# Patient Record
Sex: Female | Born: 1995 | Race: Black or African American | Hispanic: No | Marital: Single | Smoking: Current every day smoker
Health system: Southern US, Community
[De-identification: ages and names within clinical notes are randomized; demographics above are authoritative.]

## PROBLEM LIST (undated history)

## (undated) DIAGNOSIS — N390 Urinary tract infection, site not specified: Secondary | ICD-10-CM

## (undated) DIAGNOSIS — T7840XA Allergy, unspecified, initial encounter: Secondary | ICD-10-CM

## (undated) DIAGNOSIS — G43909 Migraine, unspecified, not intractable, without status migrainosus: Secondary | ICD-10-CM

## (undated) DIAGNOSIS — R51 Headache: Secondary | ICD-10-CM

## (undated) DIAGNOSIS — R519 Headache, unspecified: Secondary | ICD-10-CM

## (undated) HISTORY — DX: Migraine, unspecified, not intractable, without status migrainosus: G43.909

## (undated) HISTORY — DX: Headache, unspecified: R51.9

## (undated) HISTORY — PX: BURN TREATMENT: PRO154

## (undated) HISTORY — DX: Allergy, unspecified, initial encounter: T78.40XA

## (undated) HISTORY — DX: Headache: R51

## (undated) HISTORY — DX: Urinary tract infection, site not specified: N39.0

## (undated) HISTORY — PX: NO PAST SURGERIES: SHX2092

---
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
## 2014-04-28 ENCOUNTER — Encounter (HOSPITAL_COMMUNITY): Payer: Self-pay | Admitting: Emergency Medicine

## 2014-04-28 ENCOUNTER — Emergency Department (HOSPITAL_COMMUNITY)
Admission: EM | Admit: 2014-04-28 | Discharge: 2014-04-28 | Disposition: A | Discharge status: Home / Self Care | Payer: BC Managed Care – PPO | Attending: Emergency Medicine | Admitting: Emergency Medicine

## 2014-04-28 DIAGNOSIS — Y92410 Unspecified street and highway as the place of occurrence of the external cause: Secondary | ICD-10-CM | POA: Insufficient documentation

## 2014-04-28 DIAGNOSIS — IMO0002 Reserved for concepts with insufficient information to code with codable children: Secondary | ICD-10-CM | POA: Insufficient documentation

## 2014-04-28 DIAGNOSIS — Y9389 Activity, other specified: Secondary | ICD-10-CM | POA: Insufficient documentation

## 2014-04-28 DIAGNOSIS — S29012A Strain of muscle and tendon of back wall of thorax, initial encounter: Secondary | ICD-10-CM

## 2014-04-28 DIAGNOSIS — G44209 Tension-type headache, unspecified, not intractable: Secondary | ICD-10-CM

## 2014-04-28 MED ORDER — DIAZEPAM 5 MG PO TABS: 5.0000 mg | ORAL_TABLET | Freq: Two times a day (BID) | ORAL | Status: DC

## 2014-04-28 MED ORDER — HYDROCODONE-ACETAMINOPHEN 5-325 MG PO TABS: 1.0000 | ORAL_TABLET | Freq: Four times a day (QID) | ORAL | Status: DC | PRN

## 2014-04-28 MED ORDER — NAPROXEN 500 MG PO TABS: 500.0000 mg | ORAL_TABLET | Freq: Two times a day (BID) | ORAL | Status: DC
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
## 2014-04-28 NOTE — Discharge Instructions (Signed)
Recommend naproxen as prescribed as well as Valium for symptoms. You may take Norco as needed for severe pain. Recommend you get plenty of rest and drink plenty of water this evening. Followup with your primary care doctor in 2 days.  Tension Headache A tension headache is pain, pressure, or aching felt over the front and sides of the head. Tension headaches often come after stress, feeling worried (anxiety), or feeling sad or down for a while (depressed). HOME CARE  Only take medicine as told by your doctor.  Lie down in a dark, quiet room when you have a headache.  Keep a journal to find out if certain things bring on headaches. For example, write down:  What you eat and drink.  How much sleep you get.  Any change to your diet or medicines.  Relax by getting a massage or doing other relaxing activities.  Put ice or heat packs on the head and neck area as told by your doctor.  Lessen stress.  Sit up straight. Do not tighten (tense) your muscles.  Quit smoking if you smoke.  Lessen how much alcohol you drink.  Lessen how much caffeine you drink, or stop drinking caffeine.  Eat and exercise regularly.  Get enough sleep.  Avoid using too much pain medicine. GET HELP RIGHT AWAY IF:   Your headache becomes really bad.  You have a fever.  You have a stiff neck.  You have trouble seeing.  Your muscles are weak, or you lose muscle control.  You lose your balance or have trouble walking.  You feel like you will pass out (faint), or you pass out.  You have really bad symptoms that are different than your first symptoms.  You have problems with the medicines given to you by your doctor.  Your medicines do not work.  Your headache feels different than the other headaches.  You feel sick to your stomach (nauseous) or throw up (vomit). MAKE SURE YOU:   Understand these instructions.  Will watch your condition.  Will get help right away if you are not doing well  or get worse. Document Released: 01/17/2010 Document Revised: 01/15/2012 Document Reviewed: 10/13/2011 [facility] Patient Information 2015 [place], [place]. This information is not intended to replace advice given to you by your health care provider. Make sure you discuss any questions you have with your health care provider. Muscle Strain A muscle strain is an injury that occurs when a muscle is stretched beyond its normal length. Usually a small number of muscle fibers are torn when this happens. Muscle strain is rated in degrees. First-degree strains have the least amount of muscle fiber tearing and pain. Second-degree and third-degree strains have increasingly more tearing and pain.  Usually, recovery from muscle strain takes 1-2 weeks. Complete healing takes 5-6 weeks.  CAUSES  Muscle strain happens when a sudden, violent force placed on a muscle stretches it too far. This may occur with lifting, sports, or a fall.  RISK FACTORS Muscle strain is especially common in athletes.  SIGNS AND SYMPTOMS At the site of the muscle strain, there may be:  Pain.  Bruising.  Swelling.  Difficulty using the muscle due to pain or lack of normal function. DIAGNOSIS  Your health care provider will perform a physical exam and ask about your medical history. TREATMENT  Often, the best treatment for a muscle strain is resting, icing, and applying cold compresses to the injured area.  HOME CARE INSTRUCTIONS   Use the PRICE method of  treatment to promote muscle healing during the first 2-3 days after your injury. The PRICE method involves:  Protecting the muscle from being injured again.  Restricting your activity and resting the injured body part.  Icing your injury. To do this, put ice in a plastic bag. Place a towel between your skin and the bag. Then, apply the ice and leave it on from 15-20 minutes each hour. After the third day, switch to moist heat packs.  Apply compression to the injured area  with a splint or elastic bandage. Be careful not to wrap it too tightly. This may interfere with blood circulation or increase swelling.  Elevate the injured body part above the level of your heart as often as you can.  Only take over-the-counter or prescription medicines for pain, discomfort, or fever as directed by your health care provider.  Warming up prior to exercise helps to prevent future muscle strains. SEEK MEDICAL CARE IF:   You have increasing pain or swelling in the injured area.  You have numbness, tingling, or a significant loss of strength in the injured area. MAKE SURE YOU:   Understand these instructions.  Will watch your condition.  Will get help right away if you are not doing well or get worse. Document Released: 10/23/2005 Document Revised: 08/13/2013 Document Reviewed: 05/22/2013 [facility]ExitCare Patient Information 2015 [place], [place]. This information is not intended to replace advice given to you by your health care provider. Make sure you discuss any questions you have with your health care provider.
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
## 2014-04-29 NOTE — ED Provider Notes (Signed)
Medical screening examination/treatment/procedure(s) were performed by non-physician practitioner and as supervising physician I was immediately available for consultation/collaboration.   EKG Interpretation None        Aleathia Purdy S Daniah Zaldivar, MD 04/29/14 0934 

## 2015-04-29 ENCOUNTER — Emergency Department (HOSPITAL_COMMUNITY)
Admission: EM | Admit: 2015-04-29 | Discharge: 2015-04-29 | Disposition: A | Discharge status: Home / Self Care | Payer: BLUE CROSS/BLUE SHIELD | Attending: Emergency Medicine | Admitting: Emergency Medicine

## 2015-04-29 ENCOUNTER — Encounter (HOSPITAL_COMMUNITY): Payer: Self-pay | Admitting: Emergency Medicine

## 2015-04-29 DIAGNOSIS — F32A Depression, unspecified: Secondary | ICD-10-CM

## 2015-04-29 DIAGNOSIS — F329 Major depressive disorder, single episode, unspecified: Secondary | ICD-10-CM | POA: Diagnosis not present

## 2015-04-29 DIAGNOSIS — R45851 Suicidal ideations: Secondary | ICD-10-CM | POA: Diagnosis present

## 2015-04-29 NOTE — ED Provider Notes (Signed)
CSN: [id number]     Arrival date & time 04/29/15  1646 History   First MD Initiated Contact with Patient 04/29/15 1733     Chief Complaint  Patient presents with  . Suicidal     (Consider location/radiation/quality/duration/timing/severity/associated sxs/prior Treatment) HPI  19 year old female presents voluntarily after her depression has signal really worsened to the point that she briefly thought she wanted to die. She has no plan to commit suicide and currently is not suicidal. The patient states she's been depressed due to multiple issues, boyfriend, work, and finances. These have been increasing over the past several weeks to months. Does not currently see a psychiatrist or counselor. The patient states now she feels depressed but she does not want to kill her self or to die and she was to go home with her mother. She was to see a counselor that is approved by her mother's insurance. No homicidal thoughts. Denies substance abuse.  History reviewed. No pertinent past medical history. History reviewed. No pertinent past surgical history. History reviewed. No pertinent family history. History  Substance Use Topics  . Smoking status: Never Smoker   . Smokeless tobacco: Not on file  . Alcohol Use: No   OB History    No data available     Review of Systems  Constitutional: Negative for fever.  Respiratory: Negative for cough and shortness of breath.   Gastrointestinal: Negative for vomiting.  Psychiatric/Behavioral: Positive for suicidal ideas and dysphoric mood.  All other systems reviewed and are negative.     Allergies  Review of patient's allergies indicates no known allergies.  Home Medications   Prior to Admission medications   Medication Sig Start Date End Date Taking? Authorizing Provider  naproxen sodium (ANAPROX) 220 MG tablet Take 220 mg by mouth 2 (two) times daily as needed (headache and menstrual cramps).   Yes Historical Provider, MD  diazepam (VALIUM) 5 MG  tablet Take 1 tablet (5 mg total) by mouth 2 (two) times daily. Patient not taking: Reported on 04/29/2015 04/28/14   Antony Madura, PA-C  HYDROcodone-acetaminophen (NORCO/VICODIN) 5-325 MG per tablet Take 1 tablet by mouth every 6 (six) hours as needed for moderate pain or severe pain. Patient not taking: Reported on 04/29/2015 04/28/14   Antony Madura, PA-C  naproxen (NAPROSYN) 500 MG tablet Take 1 tablet (500 mg total) by mouth 2 (two) times daily. Patient not taking: Reported on 04/29/2015 04/28/14   Antony Madura, PA-C   BP 123/70 mmHg  Pulse 85  Temp(Src) 98.8 F (37.1 C) (Oral)  Resp 18  SpO2 100%  LMP 04/22/2015 (Approximate) Physical Exam  Constitutional: She is oriented to person, place, and time. She appears well-developed and well-nourished.  HENT:  Head: Normocephalic and atraumatic.  Right Ear: External ear normal.  Left Ear: External ear normal.  Nose: Nose normal.  Eyes: Right eye exhibits no discharge. Left eye exhibits no discharge.  Cardiovascular: Normal rate, regular rhythm and normal heart sounds.   Pulmonary/Chest: Effort normal and breath sounds normal.  Abdominal: Soft. She exhibits no distension. There is no tenderness.  Neurological: She is alert and oriented to person, place, and time.  Skin: Skin is warm and dry.  Psychiatric: She exhibits a depressed mood. She expresses no suicidal ideation. She expresses no suicidal plans.  Patient is tearful  Nursing note and vitals reviewed.   ED Course  Procedures (including critical care time) Labs Review Labs Reviewed - No data to display  Imaging Review No results found.   EKG  Interpretation None      MDM   Final diagnoses:  Depression    Patient felt increasingly depressed and briefly wanted to die but this lasted only a few seconds to minutes. Does continue to feel depressed about global life situations but at this point has no suicidal thoughts or intents. She lives with her mother and her mother feels  comfortable taking her back home. At this point while she is depressed do not feel there is any indication to involuntarily commit her and why recommended she stay for evaluation she declines. I have stressed the importance of following up closely with a psychiatrist and/or counselor and she and mother agree.    Pricilla Loveless, MD 04/29/15 1739

## 2015-04-29 NOTE — Discharge Instructions (Signed)

## 2015-04-29 NOTE — ED Notes (Signed)
Pt BIB [employer] and mom voluntarily.  States she is suicidal without plan.  Denies HI. Denies substance abuse.

## 2016-04-11 ENCOUNTER — Emergency Department (HOSPITAL_COMMUNITY)
Admission: EM | Admit: 2016-04-11 | Discharge: 2016-04-11 | Disposition: A | Discharge status: Home / Self Care | Payer: BLUE CROSS/BLUE SHIELD | Attending: Emergency Medicine | Admitting: Emergency Medicine

## 2016-04-11 ENCOUNTER — Emergency Department (HOSPITAL_COMMUNITY): Payer: BLUE CROSS/BLUE SHIELD

## 2016-04-11 ENCOUNTER — Encounter (HOSPITAL_COMMUNITY): Payer: Self-pay | Admitting: Emergency Medicine

## 2016-04-11 DIAGNOSIS — Y999 Unspecified external cause status: Secondary | ICD-10-CM | POA: Insufficient documentation

## 2016-04-11 DIAGNOSIS — M545 Low back pain: Secondary | ICD-10-CM | POA: Diagnosis not present

## 2016-04-11 DIAGNOSIS — Y939 Activity, unspecified: Secondary | ICD-10-CM | POA: Insufficient documentation

## 2016-04-11 DIAGNOSIS — Y92410 Unspecified street and highway as the place of occurrence of the external cause: Secondary | ICD-10-CM | POA: Insufficient documentation

## 2016-04-11 DIAGNOSIS — M7918 Myalgia, other site: Secondary | ICD-10-CM

## 2016-04-11 LAB — POC URINE PREG, ED: PREG TEST UR: NEGATIVE

## 2016-04-11 MED ORDER — ACETAMINOPHEN 325 MG PO TABS
650.0000 mg | ORAL_TABLET | Freq: Once | ORAL | Status: AC
  Administered 2016-04-11: 650 mg via ORAL
  Filled 2016-04-11: qty 2
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
## 2016-04-13 ENCOUNTER — Emergency Department (HOSPITAL_COMMUNITY)
Admission: EM | Admit: 2016-04-13 | Discharge: 2016-04-13 | Disposition: A | Discharge status: Home / Self Care | Payer: No Typology Code available for payment source | Attending: Emergency Medicine | Admitting: Emergency Medicine

## 2016-04-13 ENCOUNTER — Encounter (HOSPITAL_COMMUNITY): Payer: Self-pay | Admitting: Emergency Medicine

## 2016-04-13 DIAGNOSIS — Z791 Long term (current) use of non-steroidal anti-inflammatories (NSAID): Secondary | ICD-10-CM | POA: Insufficient documentation

## 2016-04-13 DIAGNOSIS — Y999 Unspecified external cause status: Secondary | ICD-10-CM | POA: Diagnosis not present

## 2016-04-13 DIAGNOSIS — R51 Headache: Secondary | ICD-10-CM | POA: Insufficient documentation

## 2016-04-13 DIAGNOSIS — Y939 Activity, unspecified: Secondary | ICD-10-CM | POA: Insufficient documentation

## 2016-04-13 DIAGNOSIS — Y92410 Unspecified street and highway as the place of occurrence of the external cause: Secondary | ICD-10-CM | POA: Insufficient documentation

## 2016-04-13 DIAGNOSIS — Z5321 Procedure and treatment not carried out due to patient leaving prior to being seen by health care provider: Secondary | ICD-10-CM | POA: Insufficient documentation
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
## 2016-04-14 ENCOUNTER — Emergency Department (HOSPITAL_COMMUNITY)
Admission: EM | Admit: 2016-04-14 | Discharge: 2016-04-14 | Disposition: A | Discharge status: Home / Self Care | Payer: BLUE CROSS/BLUE SHIELD | Attending: Emergency Medicine | Admitting: Emergency Medicine

## 2016-04-14 ENCOUNTER — Encounter (HOSPITAL_COMMUNITY): Payer: Self-pay | Admitting: Emergency Medicine

## 2016-04-14 ENCOUNTER — Emergency Department (HOSPITAL_COMMUNITY): Payer: BLUE CROSS/BLUE SHIELD

## 2016-04-14 DIAGNOSIS — Y999 Unspecified external cause status: Secondary | ICD-10-CM | POA: Diagnosis not present

## 2016-04-14 DIAGNOSIS — S0990XA Unspecified injury of head, initial encounter: Secondary | ICD-10-CM | POA: Diagnosis present

## 2016-04-14 DIAGNOSIS — Z79899 Other long term (current) drug therapy: Secondary | ICD-10-CM | POA: Diagnosis not present

## 2016-04-14 DIAGNOSIS — Y92410 Unspecified street and highway as the place of occurrence of the external cause: Secondary | ICD-10-CM | POA: Diagnosis not present

## 2016-04-14 DIAGNOSIS — Y939 Activity, unspecified: Secondary | ICD-10-CM | POA: Insufficient documentation

## 2016-04-14 MED ORDER — ACETAMINOPHEN 325 MG PO TABS
650.0000 mg | ORAL_TABLET | Freq: Once | ORAL | Status: AC
  Administered 2016-04-14: 650 mg via ORAL
  Filled 2016-04-14: qty 2

## 2016-04-14 MED ORDER — IOPAMIDOL (ISOVUE-300) INJECTION 61%: 75.0000 mL | Freq: Once | INTRAVENOUS | Status: DC | PRN
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
## 2016-08-08 ENCOUNTER — Encounter (HOSPITAL_COMMUNITY): Payer: Self-pay | Admitting: Emergency Medicine

## 2016-08-08 ENCOUNTER — Emergency Department (HOSPITAL_COMMUNITY)
Admission: EM | Admit: 2016-08-08 | Discharge: 2016-08-08 | Disposition: A | Discharge status: Home / Self Care | Payer: BLUE CROSS/BLUE SHIELD | Attending: Emergency Medicine | Admitting: Emergency Medicine

## 2016-08-08 ENCOUNTER — Emergency Department (HOSPITAL_COMMUNITY): Payer: BLUE CROSS/BLUE SHIELD

## 2016-08-08 DIAGNOSIS — Y999 Unspecified external cause status: Secondary | ICD-10-CM | POA: Diagnosis not present

## 2016-08-08 DIAGNOSIS — Y929 Unspecified place or not applicable: Secondary | ICD-10-CM | POA: Diagnosis not present

## 2016-08-08 DIAGNOSIS — S0512XA Contusion of eyeball and orbital tissues, left eye, initial encounter: Secondary | ICD-10-CM | POA: Diagnosis not present

## 2016-08-08 DIAGNOSIS — S0083XA Contusion of other part of head, initial encounter: Secondary | ICD-10-CM

## 2016-08-08 DIAGNOSIS — Y939 Activity, unspecified: Secondary | ICD-10-CM | POA: Insufficient documentation

## 2016-08-08 MED ORDER — ACETAMINOPHEN 325 MG PO TABS
650.0000 mg | ORAL_TABLET | Freq: Once | ORAL | Status: AC
  Administered 2016-08-08: 650 mg via ORAL
  Filled 2016-08-08: qty 2

## 2016-08-08 MED ORDER — NAPROXEN 500 MG PO TABS: 500.0000 mg | ORAL_TABLET | Freq: Two times a day (BID) | ORAL | 0 refills | Status: DC
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
## 2016-08-08 NOTE — Discharge Instructions (Signed)
Apply ice to affected area. Take Naprosyn as needed for pain. Follow up with ENT if symptoms do not improve. Return to the ED if you experience severe worsening of your symptoms, vision change, difficulty breathing or opening eye.
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
## 2017-05-11 ENCOUNTER — Ambulatory Visit: Payer: Self-pay | Admitting: Family Medicine

## 2017-05-22 ENCOUNTER — Ambulatory Visit: Payer: Self-pay | Admitting: Family Medicine

## 2017-05-28 ENCOUNTER — Emergency Department (HOSPITAL_COMMUNITY): Payer: BLUE CROSS/BLUE SHIELD

## 2017-05-28 ENCOUNTER — Encounter (HOSPITAL_COMMUNITY): Payer: Self-pay | Admitting: Emergency Medicine

## 2017-05-28 ENCOUNTER — Emergency Department (HOSPITAL_COMMUNITY)
Admission: EM | Admit: 2017-05-28 | Discharge: 2017-05-28 | Disposition: A | Discharge status: Home / Self Care | Payer: BLUE CROSS/BLUE SHIELD | Attending: Emergency Medicine | Admitting: Emergency Medicine

## 2017-05-28 DIAGNOSIS — R1012 Left upper quadrant pain: Secondary | ICD-10-CM | POA: Diagnosis present

## 2017-05-28 DIAGNOSIS — Z79899 Other long term (current) drug therapy: Secondary | ICD-10-CM | POA: Diagnosis not present

## 2017-05-28 DIAGNOSIS — R51 Headache: Secondary | ICD-10-CM | POA: Diagnosis not present

## 2017-05-28 DIAGNOSIS — K920 Hematemesis: Secondary | ICD-10-CM | POA: Diagnosis not present

## 2017-05-28 DIAGNOSIS — N3001 Acute cystitis with hematuria: Secondary | ICD-10-CM | POA: Diagnosis not present

## 2017-05-28 DIAGNOSIS — R509 Fever, unspecified: Secondary | ICD-10-CM | POA: Diagnosis not present

## 2017-05-28 DIAGNOSIS — R519 Headache, unspecified: Secondary | ICD-10-CM

## 2017-05-28 LAB — COMPREHENSIVE METABOLIC PANEL
ALBUMIN: 4.2 g/dL (ref 3.5–5.0)
ALT: 19 U/L (ref 14–54)
AST: 21 U/L (ref 15–41)
Alkaline Phosphatase: 86 U/L (ref 38–126)
Anion gap: 9 (ref 5–15)
BILIRUBIN TOTAL: 0.4 mg/dL (ref 0.3–1.2)
BUN: 10 mg/dL (ref 6–20)
CO2: 23 mmol/L (ref 22–32)
CREATININE: 0.88 mg/dL (ref 0.44–1.00)
Calcium: 9.5 mg/dL (ref 8.9–10.3)
Chloride: 106 mmol/L (ref 101–111)
GFR calc Af Amer: >60 mL/min (ref >60)
GLUCOSE: 118 mg/dL — AB (ref 65–99)
POTASSIUM: 4.3 mmol/L (ref 3.5–5.1)
Sodium: 138 mmol/L (ref 135–145)
TOTAL PROTEIN: 8.2 g/dL — AB (ref 6.5–8.1)

## 2017-05-28 LAB — URINALYSIS, ROUTINE W REFLEX MICROSCOPIC
BILIRUBIN URINE: NEGATIVE
Glucose, UA: NEGATIVE mg/dL
KETONES UR: NEGATIVE mg/dL
Nitrite: NEGATIVE
PROTEIN: 30 mg/dL — AB
Specific Gravity, Urine: 1.021 (ref 1.005–1.030)
pH: 5 (ref 5.0–8.0)

## 2017-05-28 LAB — CBC
HEMATOCRIT: 37.8 % (ref 36.0–46.0)
HEMOGLOBIN: 13.2 g/dL (ref 12.0–15.0)
MCH: 30.6 pg (ref 26.0–34.0)
MCHC: 34.9 g/dL (ref 30.0–36.0)
MCV: 87.7 fL (ref 78.0–100.0)
Platelets: 341 10*3/uL (ref 150–400)
RBC: 4.31 MIL/uL (ref 3.87–5.11)
RDW: 12.7 % (ref 11.5–15.5)
WBC: 9.6 10*3/uL (ref 4.0–10.5)

## 2017-05-28 LAB — I-STAT BETA HCG BLOOD, ED (MC, WL, AP ONLY): I-stat hCG, quantitative: <5 m[IU]/mL (ref <5)

## 2017-05-28 LAB — LIPASE, BLOOD: Lipase: 20 U/L (ref 11–51)

## 2017-05-28 MED ORDER — CEFTRIAXONE SODIUM 1 G IJ SOLR
1.0000 g | Freq: Once | INTRAMUSCULAR | Status: AC
  Administered 2017-05-28: 1 g via INTRAMUSCULAR
  Filled 2017-05-28: qty 10

## 2017-05-28 MED ORDER — METOCLOPRAMIDE HCL 5 MG/ML IJ SOLN
10.0000 mg | Freq: Once | INTRAMUSCULAR | Status: DC
  Filled 2017-05-28: qty 2

## 2017-05-28 MED ORDER — OMEPRAZOLE 20 MG PO CPDR: 20.0000 mg | DELAYED_RELEASE_CAPSULE | Freq: Two times a day (BID) | ORAL | 0 refills | Status: DC

## 2017-05-28 MED ORDER — DIPHENHYDRAMINE HCL 50 MG/ML IJ SOLN
25.0000 mg | Freq: Once | INTRAMUSCULAR | Status: DC
  Filled 2017-05-28: qty 1

## 2017-05-28 MED ORDER — KETOROLAC TROMETHAMINE 15 MG/ML IJ SOLN
30.0000 mg | Freq: Once | INTRAMUSCULAR | Status: DC
  Filled 2017-05-28: qty 2

## 2017-05-28 MED ORDER — STERILE WATER FOR INJECTION IJ SOLN
INTRAMUSCULAR | Status: AC
  Filled 2017-05-28: qty 10

## 2017-05-28 MED ORDER — SODIUM CHLORIDE 0.9 % IV BOLUS (SEPSIS): 1000.0000 mL | Freq: Once | INTRAVENOUS | Status: DC

## 2017-05-28 MED ORDER — AMOXICILLIN-POT CLAVULANATE 875-125 MG PO TABS: 1.0000 | ORAL_TABLET | Freq: Two times a day (BID) | ORAL | 0 refills | Status: AC

## 2017-05-28 MED ORDER — ONDANSETRON 4 MG PO TBDP
4.0000 mg | ORAL_TABLET | Freq: Once | ORAL | Status: AC | PRN
  Administered 2017-05-28: 4 mg via ORAL
  Filled 2017-05-28: qty 1

## 2017-05-28 NOTE — ED Triage Notes (Addendum)
Patient states for the pass couple hours she has not been able to sleep. Patient states that she has been throwing up and having a bad headache. Patient also states that she is having left flank pain.

## 2017-05-28 NOTE — ED Provider Notes (Signed)
[facility]-EMERGENCY DEPT Provider Note   CSN: [id number] Arrival date & time: 05/28/17  0356     History   Chief Complaint Chief Complaint  Patient presents with  . Emesis  . Headache  . Flank Pain    HPI Tracey Harris is a 21 y.o. female with no pertinent past medical history who presents a with chief complaint acute onset, progressively improving left upper quadrant abdominal pain since this morning and intermittent, progressively worsening headache for the past 3 days. She states she woke up at 2 AM with acute onset of sharp colicky pain in the left upper quadrant and multiple episodes of nonbilious, eventually bloody emesis. Also endorses hematuria. Denies melena, diarrhea, constipation. She endorses subjective fevers and chills. Headache is similar to migraines she has had in the past. She states she takes 6-8 tablets of a combination of Excedrin, Tylenol, and ibuprofen for her headaches without relief. Denies photophobia, phonophobia or vision changes. No neck pain. No aggravating or alleviating factors. Zofran given in the ED has been helpful. No prior history of kidney stones.  The history is provided by the patient.  Headache   Associated symptoms include a fever, nausea and vomiting. Pertinent negatives include no shortness of breath.  Flank Pain  Associated symptoms include abdominal pain and headaches. Pertinent negatives include no chest pain and no shortness of breath.    History reviewed. No pertinent past medical history.  There are no active problems to display for this patient.   History reviewed. No pertinent surgical history.  OB History    No data available       Home Medications    Prior to Admission medications   Medication Sig Start Date End Date Taking? Authorizing Provider  acetaminophen (TYLENOL) 500 MG tablet Take 500 mg by mouth every 6 (six) hours as needed for moderate pain.   Yes [provider]  amoxicillin-clavulanate (AUGMENTIN)  875-125 MG tablet Take 1 tablet by mouth every 12 (twelve) hours. 05/28/17 06/07/17  Michela Pitcher A, PA-C  diazepam (VALIUM) 5 MG tablet Take 1 tablet (5 mg total) by mouth 2 (two) times daily. Patient not taking: Reported on 04/29/2015 04/28/14   Antony Madura, PA-C  omeprazole (PRILOSEC) 20 MG capsule Take 1 capsule (20 mg total) by mouth 2 (two) times daily before a meal. 05/28/17 06/11/17  Jeanie Sewer, PA-C    Family History History reviewed. No pertinent family history.  Social History Social History  Substance Use Topics  . Smoking status: Never Smoker  . Smokeless tobacco: Never Used  . Alcohol use No     Allergies   Patient has no known allergies.   Review of Systems Review of Systems  Constitutional: Positive for chills and fever.  Respiratory: Negative for shortness of breath.   Cardiovascular: Negative for chest pain.  Gastrointestinal: Positive for abdominal pain, nausea and vomiting. Negative for blood in stool, constipation and diarrhea.  Genitourinary: Positive for flank pain and hematuria.  Musculoskeletal: Negative for neck pain.  Neurological: Positive for headaches. Negative for syncope.     Physical Exam Updated Vital Signs BP 125/82 (BP Location: Left Arm)   Pulse 72   Temp 98.8 F (37.1 C) (Oral)   Resp 20   Ht 5\' 9"  (1.753 m)   Wt 76.2 kg (168 lb)   SpO2 100%   BMI 24.81 kg/m   Physical Exam  Constitutional: She is oriented to person, place, and time. She appears well-developed and well-nourished. No distress.  HENT:  Head: Normocephalic and atraumatic.  Eyes: Pupils are equal, round, and reactive to light. Conjunctivae are normal. Right eye exhibits no discharge. Left eye exhibits no discharge.  Neck: Normal range of motion. Neck supple. No JVD present. No tracheal deviation present.  Cardiovascular: Normal rate, regular rhythm and normal heart sounds.   Pulmonary/Chest: Effort normal and breath sounds normal. No respiratory distress. She has no  wheezes. She has no rales.  Abdominal: Soft. Bowel sounds are normal. She exhibits no distension. There is tenderness. There is no guarding.  Left upper quadrant TTP. Murphy's absent, Rovsing's absent, no tenderness to palpation at McBurney's point. Left CVA tenderness present.  Musculoskeletal: She exhibits no edema.  No midline spine TTP, left paraspinal lumbar muscle tenderness, no deformity, crepitus, or step-off noted   Neurological: She is alert and oriented to person, place, and time.  Fluent speech, no facial droop, sensation intact to soft touch of extremities, cranial nerves III through XII tested and intact, normal gait, and patient able to heel walk and toe walk without difficulty.   Skin: Skin is warm and dry. No erythema.  Psychiatric: She has a normal mood and affect. Her behavior is normal.  Nursing note and vitals reviewed.    ED Treatments / Results  Labs (all labs ordered are listed, but only abnormal results are displayed) Labs Reviewed  COMPREHENSIVE METABOLIC PANEL - Abnormal; Notable for the following:       Result Value   Glucose, Bld 118 (*)    Total Protein 8.2 (*)    All other components within normal limits  URINALYSIS, ROUTINE W REFLEX MICROSCOPIC - Abnormal; Notable for the following:    APPearance HAZY (*)    Hgb urine dipstick LARGE (*)    Protein, ur 30 (*)    Leukocytes, UA TRACE (*)    Bacteria, UA RARE (*)    Squamous Epithelial / LPF 6-30 (*)    All other components within normal limits  URINE CULTURE  LIPASE, BLOOD  CBC  I-STAT BETA HCG BLOOD, ED ([facility], [facility], [facility] ONLY)    EKG  EKG Interpretation None       Radiology Ct Renal Stone Study  Result Date: 05/28/2017 CLINICAL DATA:  21 year old female with left flank pain, nausea and vomiting for several hours. Red blood cells and white blood cells in urine. Negative pregnancy test. Initial encounter. EXAM: CT ABDOMEN AND PELVIS WITHOUT CONTRAST TECHNIQUE: Multidetector CT imaging of the  abdomen and pelvis was performed following the standard protocol without IV contrast. COMPARISON:  None. FINDINGS: Lower chest: Left base subsegmental atelectasis. Heart size within normal limits. Hepatobiliary: Taking into account limitation by non contrast imaging, no worrisome mass. No calcified gallstones. Pancreas: Taking into account limitation by non contrast imaging, no mass or inflammation. Spleen: Taking into account limitation by non contrast imaging, no mass or enlargement. Adrenals/Urinary Tract: No renal or ureteral obstructing stone or evidence hydronephrosis. There may be a tiny nonobstructing right upper pole renal calculus. Taking into account limitation by non contrast imaging, no renal or adrenal mass. No perinephric stranding. Partially decompressed noncontrast filled urinary bladder without gross abnormality. Stomach/Bowel: No extraluminal bowel inflammatory process, free fluid or free air. Specifically, no inflammation surrounds the appendix or terminal ileum. Vascular/Lymphatic: No aortic aneurysm.  No adenopathy. Reproductive: Right ovary greater than left without gross abnormality detected by CT. Uterus tilted to the left. Other: No bowel containing hernia. Musculoskeletal: No acute osseous abnormality. IMPRESSION: No renal or ureteral obstructing stone or evidence hydronephrosis. There  may be a tiny nonobstructing right upper pole renal calculus. No bowel inflammatory process identified. Electronically Signed   By: Lacy DuverneySteven  Olson M.D.   On: 05/28/2017 07:25    Procedures Procedures (including critical care time)  Medications Ordered in ED Medications  ketorolac (TORADOL) 15 MG/ML injection 30 mg (not administered)  sodium chloride 0.9 % bolus 1,000 mL (not administered)  metoCLOPramide (REGLAN) injection 10 mg (not administered)  diphenhydrAMINE (BENADRYL) injection 25 mg (not administered)  cefTRIAXone (ROCEPHIN) injection 1 g (not administered)  ondansetron (ZOFRAN-ODT)  disintegrating tablet 4 mg (4 mg Oral Given 05/28/17 0417)     Initial Impression / Assessment and Plan / ED Course  I have reviewed the triage vital signs and the nursing notes.  Pertinent labs & imaging results that were available during my care of the patient were reviewed by me and considered in my medical decision making (see chart for details).     Patient with left-sided abdominal pain and headache. Afebrile, vital signs are stable, no focal neurological deficits. Patient refused IV treatment of headache and states she is feeling better. No leukocytosis, no other joint abnormalities. UA with leukocytes and RBCs. CT renal stone shows no renal or ureteric or obstructing stones and no other abnormalities. Low suspicion of appendicitis, colitis, perforated viscus, or surgical abdomen. Coupled with UA and urinary symptoms, will treat for hemorrhagic cystitis. IM Rocephin given in the ED, discharge home with Augmentin. Suspect there may be a component of gastritis secondary to ibuprofen and Excedrin use. Will discharge with 2 week course of Prilosec and recommend decreased NSAID use and discontinuation if GI upset occurs. She has follow-up with primary care in 1 week, and I recommend she be reevaluated at this time for recurring headaches and her abdominal pain. Patient is tolerating by mouth, ambulatory, and in no apparent distress on reevaluation. Repeat abdominal exam unremarkable. Discussed indications for return to the ED. Patient and patient's mother verbalized understanding of and agreement with plan and patient is stable for discharge home at this time.  Final Clinical Impressions(s) / ED Diagnoses   Final diagnoses:  Acute cystitis with hematuria  Bad headache    New Prescriptions New Prescriptions   AMOXICILLIN-CLAVULANATE (AUGMENTIN) 875-125 MG TABLET    Take 1 tablet by mouth every 12 (twelve) hours.   OMEPRAZOLE (PRILOSEC) 20 MG CAPSULE    Take 1 capsule (20 mg total) by mouth 2  (two) times daily before a meal.     Jeanie SewerFawze, Parish [place] A, PA-C 05/28/17 0844    Paula LibraMolpus, John, MD 05/30/17 2238

## 2017-05-28 NOTE — Discharge Instructions (Signed)
Please take all of your antibiotics until finished!   You may develop abdominal discomfort or diarrhea from the antibiotic.  You may help offset this with probiotics which you can buy or get in yogurt. Do not eat  or take the probiotics until 2 hours after your antibiotic.   Take Prilosec twice daily before meals. Try to avoid ibuprofen, and stop taking it if it upsets her stomach. Follow-up with your primary care physician in the next week as scheduled for reevaluation of your recurring headaches and for reevaluation of your abdominal pain. Return to the ED immediately if any concerning signs or symptoms develop such as fevers, blood in your urine or stool, worsening pain extending up the low back, neurological symptoms or generalized weakness.

## 2017-05-29 LAB — URINE CULTURE

## 2017-06-11 ENCOUNTER — Ambulatory Visit (INDEPENDENT_AMBULATORY_CARE_PROVIDER_SITE_OTHER): Payer: BLUE CROSS/BLUE SHIELD | Admitting: Family Medicine

## 2017-06-11 ENCOUNTER — Encounter: Payer: Self-pay | Admitting: Family Medicine

## 2017-06-11 DIAGNOSIS — G8929 Other chronic pain: Secondary | ICD-10-CM | POA: Insufficient documentation

## 2017-06-11 DIAGNOSIS — R51 Headache: Secondary | ICD-10-CM

## 2017-06-11 DIAGNOSIS — R519 Headache, unspecified: Secondary | ICD-10-CM | POA: Insufficient documentation

## 2017-06-11 DIAGNOSIS — G4700 Insomnia, unspecified: Secondary | ICD-10-CM | POA: Diagnosis not present

## 2017-06-11 DIAGNOSIS — J309 Allergic rhinitis, unspecified: Secondary | ICD-10-CM | POA: Diagnosis not present

## 2017-06-11 MED ORDER — AMITRIPTYLINE HCL 25 MG PO TABS: 25.0000 mg | ORAL_TABLET | Freq: Every day | ORAL | 1 refills | Status: DC

## 2017-06-11 NOTE — Progress Notes (Signed)
HPI:   Tracey Harris is a 21 y.o. female, who is here today to establish care.  Former PCP: N/A Last preventive routine visit: 11/2016 gyn preventive after undergoing abortion procedure.  Chronic medical problems: Migraines,GERD, tobacco use disorder,and UTI among some.  She was in the ER recently c/o flank pain, nausea,and vomiting. Dx with UTI, completed treatment with Augmentin.  She is taking Prilosec daiy and symptoms resolved.   Concerns today: Headache getting more frequent.  Since 18 years. Headaches getting worse for past year, frontal and temporal, tightness sensation, max 10/10. She takes Tylenol,Aleve,Ecxedrin migraines but nothing helps. She is having headaches almost daily. Usually headache started while she was at work but for the past months she has been waken up with headache. She works from 2 am to 10 am. Headache starts gradually and becomes more intense at the end of the day.  Sometimes nausea. No vomiting or photophobia.  No FHx of migraines. Last year eye exam.   For the past months she has had  trouble falling and staying asleep. Sleeping about 3--4 hours. Skin pruritus with heat aggravates problem, has not noted rash but she has Hx of eczema.   Started Depo in 11/2016.   Review of Systems  Constitutional: Positive for fatigue. Negative for activity change, appetite change, fever and unexpected weight change.  HENT: Negative for mouth sores, nosebleeds, sore throat and trouble swallowing.   Eyes: Negative for redness and visual disturbance.  Respiratory: Negative for cough, shortness of breath and wheezing.   Cardiovascular: Negative for chest pain, palpitations and leg swelling.  Gastrointestinal: Negative for abdominal pain, nausea and vomiting.       Negative for changes in bowel habits.  Endocrine: Negative for cold intolerance, heat intolerance, polydipsia, polyphagia and polyuria.  Genitourinary: Negative for decreased  urine volume, difficulty urinating, dysuria and hematuria.  Musculoskeletal: Negative for gait problem and myalgias.  Skin: Negative for rash and wound.  Allergic/Immunologic: Positive for environmental allergies.  Neurological: Positive for headaches. Negative for seizures, syncope, weakness and numbness.  Hematological: Negative for adenopathy. Does not bruise/bleed easily.  Psychiatric/Behavioral: Positive for sleep disturbance. Negative for confusion. The patient is not nervous/anxious.       Current Outpatient Prescriptions on File Prior to Visit  Medication Sig Dispense Refill  . omeprazole (PRILOSEC) 20 MG capsule Take 1 capsule (20 mg total) by mouth 2 (two) times daily before a meal. 28 capsule 0   No current facility-administered medications on file prior to visit.      Past Medical History:  Diagnosis Date  . Allergy   . Frequent headaches   . Migraines   . Urinary tract infection    No Known Allergies  Family History  Problem Relation Age of Onset  . Diabetes Mother   . Hypertension Maternal Aunt   . Hypertension Maternal Uncle   . Hypertension Maternal Grandmother   . Cancer Neg Hx     Social History   Social History  . Marital status: Single    Spouse name: N/A  . Number of children: N/A  . Years of education: N/A   Social History Main Topics  . Smoking status: Current Every Day Smoker  . Smokeless tobacco: Never Used  . Alcohol use Yes  . Drug use: No  . Sexual activity: Not Asked   Other Topics Concern  . None   Social History Narrative  . None    Vitals:   06/11/17 0653  BP: 118/80  Pulse: 96  Resp: 12  SpO2: 95%  O2 sat at RA 95%  Body mass index is 23.66 kg/m.   Physical Exam  Nursing note and vitals reviewed. Constitutional: She is oriented to person, place, and time. She appears well-developed and well-nourished. She does not appear ill. No distress.  HENT:  Head: Atraumatic.  Right Ear: External ear and ear canal  normal.  Left Ear: External ear and ear canal normal.  Nose: Right sinus exhibits no maxillary sinus tenderness and no frontal sinus tenderness. Left sinus exhibits no maxillary sinus tenderness and no frontal sinus tenderness.  Mouth/Throat: Oropharynx is clear and moist and mucous membranes are normal.  Cerumen excess bilateral, not able to see TM's Nasal voice.  Eyes: Pupils are equal, round, and reactive to light. Conjunctivae and EOM are normal.  Neck: No muscular tenderness present. No edema and no erythema present.  Cardiovascular: Normal rate and regular rhythm.   No murmur heard. Respiratory: Effort normal and breath sounds normal. No respiratory distress.  GI: Soft. She exhibits no mass. There is no hepatomegaly. There is no tenderness.  Musculoskeletal: She exhibits no edema or tenderness.       Cervical back: She exhibits normal range of motion and no tenderness.  Lymphadenopathy:    She has no cervical adenopathy.  Neurological: She is alert and oriented to person, place, and time. She has normal strength. No cranial nerve deficit. Coordination and gait normal.  Reflex Scores:      Patellar reflexes are 2+ on the right side and 2+ on the left side. Skin: Skin is warm. No rash noted. No erythema.  Psychiatric: She has a normal mood and affect. Her speech is normal.  Well groomed, good eye contact.     ASSESSMENT AND PLAN:   [name] was seen today for establish care.  Diagnoses and all orders for this visit:  Chronic nonintractable headache, unspecified headache type  We discussed possible causes of headaches: Migraine,tension headache,allergies among some. ? Tension headache. After discussion of some side effects she agrees with trying Amitriptyline, she can start with 1/2 tab at the time and increase to a tab in 1-2 weeks. Instructed about warning signs. F/U in 6 weeks.   -     amitriptyline (ELAVIL) 25 MG tablet; Take 1 tablet (25 mg total) by mouth at  bedtime.  Allergic rhinitis, unspecified seasonality, unspecified trigger  Can also aggravate headaches. OTC intra nasal steroid daily as needed and nasal saline amy help. Zyrtec 10 mg daily.   Insomnia, unspecified type  Good sleep hygiene. OTC Melatonin may help. Amitriptyline also will help.     Davion Flannery G. [place], MD  [facility]. [place] office.

## 2017-06-11 NOTE — Patient Instructions (Signed)
A few things to remember from today's visit:   Chronic nonintractable headache, unspecified headache type - Plan: amitriptyline (ELAVIL) 25 MG tablet  Allergic rhinitis, unspecified seasonality, unspecified trigger  ? Tension headache vs allergies vs migraine.  Over the counter Zyrtec 10 mg daily . Amitriptyline 1/2 tab at bedtime and increase dose to 1 tab in 2 weeks if tolerated.   Please be sure medication list is accurate. If a new problem present, please set up appointment sooner than planned today.

## 2017-06-14 ENCOUNTER — Encounter: Payer: Self-pay | Admitting: Family Medicine

## 2017-07-22 NOTE — Progress Notes (Deleted)
      HPI:   [name] is a 21 y.o. female, who is here today to follow on recent OV.   She was seen on 06/11/17, when she was c/o frequent headaches, fronto-temporal since age 30. Almost daily and occasionally associated with nausea. Amitriptyline 25 mg was recommended.  Because Hx of allergic rhinitis, OTC Zyrtec and Flonase nasal spray was also recommended.   *** She has had about *** headache since her last OV.  Insomnia: Trouble falling and staying asleep. Melatonin recommended *** Sleeping about *** (3-4 hours)   Review of Systems    Current Outpatient Prescriptions on File Prior to Visit  Medication Sig Dispense Refill  . amitriptyline (ELAVIL) 25 MG tablet Take 1 tablet (25 mg total) by mouth at bedtime. 30 tablet 1  . omeprazole (PRILOSEC) 20 MG capsule Take 1 capsule (20 mg total) by mouth 2 (two) times daily before a meal. 28 capsule 0   No current facility-administered medications on file prior to visit.      Past Medical History:  Diagnosis Date  . Allergy   . Frequent headaches   . Migraines   . Urinary tract infection    No Known Allergies  Social History   Social History  . Marital status: Single    Spouse name: N/A  . Number of children: N/A  . Years of education: N/A   Social History Main Topics  . Smoking status: Current Every Day Smoker  . Smokeless tobacco: Never Used  . Alcohol use Yes  . Drug use: No  . Sexual activity: Not on file   Other Topics Concern  . Not on file   Social History Narrative  . No narrative on file    There were no vitals filed for this visit. There is no height or weight on file to calculate BMI.      Physical Exam    ASSESSMENT AND PLAN:     Diagnoses and all orders for this visit:  Chronic nonintractable headache, unspecified headache type  Insomnia, unspecified type               Betty G. [place], MD  [facility]. [place]  office.

## 2017-07-23 ENCOUNTER — Ambulatory Visit: Payer: BLUE CROSS/BLUE SHIELD | Admitting: Family Medicine

## 2017-08-16 ENCOUNTER — Other Ambulatory Visit: Payer: Self-pay | Admitting: Family Medicine

## 2017-08-16 DIAGNOSIS — R519 Headache, unspecified: Secondary | ICD-10-CM

## 2017-08-16 DIAGNOSIS — R51 Headache: Principal | ICD-10-CM

## 2017-11-12 ENCOUNTER — Encounter (HOSPITAL_BASED_OUTPATIENT_CLINIC_OR_DEPARTMENT_OTHER): Payer: Self-pay

## 2017-11-12 ENCOUNTER — Emergency Department (HOSPITAL_BASED_OUTPATIENT_CLINIC_OR_DEPARTMENT_OTHER)
Admission: EM | Admit: 2017-11-12 | Discharge: 2017-11-13 | Disposition: A | Discharge status: Home / Self Care | Payer: Managed Care, Other (non HMO) | Attending: Emergency Medicine | Admitting: Emergency Medicine

## 2017-11-12 ENCOUNTER — Other Ambulatory Visit: Payer: Self-pay

## 2017-11-12 ENCOUNTER — Emergency Department (HOSPITAL_COMMUNITY)
Admission: EM | Admit: 2017-11-12 | Discharge: 2017-11-12 | Discharge status: Against Medical Advice | Payer: BLUE CROSS/BLUE SHIELD

## 2017-11-12 DIAGNOSIS — R42 Dizziness and giddiness: Secondary | ICD-10-CM | POA: Diagnosis present

## 2017-11-12 DIAGNOSIS — R002 Palpitations: Secondary | ICD-10-CM | POA: Insufficient documentation

## 2017-11-12 DIAGNOSIS — F17210 Nicotine dependence, cigarettes, uncomplicated: Secondary | ICD-10-CM | POA: Diagnosis not present

## 2017-11-12 LAB — COMPREHENSIVE METABOLIC PANEL
ALK PHOS: 102 U/L (ref 38–126)
ALT: 18 U/L (ref 14–54)
AST: 14 U/L — AB (ref 15–41)
Albumin: 4.1 g/dL (ref 3.5–5.0)
Anion gap: 7 (ref 5–15)
BUN: 8 mg/dL (ref 6–20)
CHLORIDE: 107 mmol/L (ref 101–111)
CO2: 25 mmol/L (ref 22–32)
Calcium: 9.6 mg/dL (ref 8.9–10.3)
Creatinine, Ser: 0.85 mg/dL (ref 0.44–1.00)
GFR calc Af Amer: >60 mL/min (ref >60)
GFR calc non Af Amer: >60 mL/min (ref >60)
GLUCOSE: 81 mg/dL (ref 65–99)
Potassium: 3.9 mmol/L (ref 3.5–5.1)
SODIUM: 139 mmol/L (ref 135–145)
Total Bilirubin: 0.5 mg/dL (ref 0.3–1.2)
Total Protein: 7.9 g/dL (ref 6.5–8.1)

## 2017-11-12 LAB — LIPASE, BLOOD: LIPASE: 23 U/L (ref 11–51)

## 2017-11-12 LAB — CBC WITH DIFFERENTIAL/PLATELET
Basophils Absolute: 0 10*3/uL (ref 0.0–0.1)
Basophils Relative: 0 %
EOS PCT: 2 %
Eosinophils Absolute: 0.2 10*3/uL (ref 0.0–0.7)
HCT: 37.4 % (ref 36.0–46.0)
HEMOGLOBIN: 12.5 g/dL (ref 12.0–15.0)
LYMPHS ABS: 3 10*3/uL (ref 0.7–4.0)
Lymphocytes Relative: 35 %
MCH: 29.3 pg (ref 26.0–34.0)
MCHC: 33.4 g/dL (ref 30.0–36.0)
MCV: 87.8 fL (ref 78.0–100.0)
MONO ABS: 0.4 10*3/uL (ref 0.1–1.0)
MONOS PCT: 5 %
Neutro Abs: 5 10*3/uL (ref 1.7–7.7)
Neutrophils Relative %: 58 %
PLATELETS: 336 10*3/uL (ref 150–400)
RBC: 4.26 MIL/uL (ref 3.87–5.11)
RDW: 13.8 % (ref 11.5–15.5)
WBC: 8.6 10*3/uL (ref 4.0–10.5)

## 2017-11-12 LAB — URINALYSIS, ROUTINE W REFLEX MICROSCOPIC
Bilirubin Urine: NEGATIVE
Glucose, UA: NEGATIVE mg/dL
Hgb urine dipstick: NEGATIVE
Ketones, ur: NEGATIVE mg/dL
LEUKOCYTES UA: NEGATIVE
Nitrite: NEGATIVE
PROTEIN: NEGATIVE mg/dL
pH: 6.5 (ref 5.0–8.0)

## 2017-11-12 LAB — PREGNANCY, URINE: PREG TEST UR: NEGATIVE

## 2017-11-12 NOTE — ED Triage Notes (Signed)
Patient left prior to being triaged.   

## 2017-11-12 NOTE — ED Provider Notes (Signed)
[facility] Provider Note: [name], MD, FACEP  CSN: [id number] MRN: [id number] ARRIVAL: 11/12/17 at 2022 ROOM: MH04/MH04   CHIEF COMPLAINT  Fatigue   HISTORY OF PRESENT ILLNESS  11/12/17 11:37 PM Tracey Harris is a 22 y.o. female with about a 2-week history of episodic lightheadedness.  She estimates she has had 2 episodes daily during that time.  These episodes are accompanied by a sensation of her heart racing, anxiety, somewhat harder breathing than usual and sweating.  She denies nausea, vomiting or diarrhea.  These episodes abate rapidly once she sits down or get something to drink.    Past Medical History:  Diagnosis Date  . Allergy   . Frequent headaches   . Migraines   . Urinary tract infection     History reviewed. No pertinent surgical history.  Family History  Problem Relation Age of Onset  . Diabetes Mother   . Hypertension Maternal Aunt   . Hypertension Maternal Uncle   . Hypertension Maternal Grandmother   . Cancer Neg Hx     Social History   Tobacco Use  . Smoking status: Current Every Day Smoker  . Smokeless tobacco: Never Used  Substance Use Topics  . Alcohol use: Yes    Comment: weekly  . Drug use: No    Prior to Admission medications   Medication Sig Start Date End Date Taking? Authorizing Provider  omeprazole (PRILOSEC) 20 MG capsule Take 1 capsule (20 mg total) by mouth 2 (two) times daily before a meal. 05/28/17 06/11/17  Luevenia MaxinFawze, Mina A, PA-C    Allergies Other   REVIEW OF SYSTEMS  Negative except as noted here or in the History of Present Illness.   PHYSICAL EXAMINATION  Initial Vital Signs Blood pressure (!) 156/94, pulse (!) 114, temperature 98.7 F (37.1 C), temperature source Oral, resp. rate 18, height 5' 7.5" (1.715 m), weight 77.6 kg (171 lb 1.2 oz), SpO2 99 %.  Examination General: Well-developed, well-nourished female in no acute distress; appearance consistent with age of record HENT:  normocephalic; atraumatic Eyes: pupils equal, round and reactive to light; extraocular muscles intact Neck: supple Heart: regular rate and rhythm Lungs: clear to auscultation bilaterally Abdomen: soft; nondistended; nontender; bowel sounds present Extremities: No deformity; full range of motion; pulses normal Neurologic: Awake, alert and oriented; motor function intact in all extremities and symmetric; no facial droop Skin: Warm and dry Psychiatric: Normal mood and affect   RESULTS  Summary of this visit's results, reviewed by myself:   EKG Interpretation  Date/Time:    Ventricular Rate:    PR Interval:    QRS Duration:   QT Interval:    QTC Calculation:   R Axis:     Text Interpretation:        Laboratory Studies: Results for orders placed or performed during the hospital encounter of 11/12/17 (from the past 24 hour(s))  Pregnancy, urine     Status: None   Collection Time: 11/12/17  8:44 PM  Result Value Ref Range   Preg Test, Ur NEGATIVE NEGATIVE  Urinalysis, Routine w reflex microscopic     Status: Abnormal   Collection Time: 11/12/17  8:44 PM  Result Value Ref Range   Color, Urine YELLOW YELLOW   APPearance CLEAR CLEAR   Specific Gravity, Urine <1.005 (L) 1.005 - 1.030   pH 6.5 5.0 - 8.0   Glucose, UA NEGATIVE NEGATIVE mg/dL   Hgb urine dipstick NEGATIVE NEGATIVE   Bilirubin Urine NEGATIVE NEGATIVE  Ketones, ur NEGATIVE NEGATIVE mg/dL   Protein, ur NEGATIVE NEGATIVE mg/dL   Nitrite NEGATIVE NEGATIVE   Leukocytes, UA NEGATIVE NEGATIVE  Comprehensive metabolic panel     Status: Abnormal   Collection Time: 11/12/17  9:56 PM  Result Value Ref Range   Sodium 139 135 - 145 mmol/L   Potassium 3.9 3.5 - 5.1 mmol/L   Chloride 107 101 - 111 mmol/L   CO2 25 22 - 32 mmol/L   Glucose, Bld 81 65 - 99 mg/dL   BUN 8 6 - 20 mg/dL   Creatinine, Ser 4.09 0.44 - 1.00 mg/dL   Calcium 9.6 8.9 - 81.1 mg/dL   Total Protein 7.9 6.5 - 8.1 g/dL   Albumin 4.1 3.5 - 5.0 g/dL    AST 14 (L) 15 - 41 U/L   ALT 18 14 - 54 U/L   Alkaline Phosphatase 102 38 - 126 U/L   Total Bilirubin 0.5 0.3 - 1.2 mg/dL   GFR calc non Af Amer >60 >60 mL/min   GFR calc Af Amer >60 >60 mL/min   Anion gap 7 5 - 15  CBC with Differential     Status: None   Collection Time: 11/12/17  9:56 PM  Result Value Ref Range   WBC 8.6 4.0 - 10.5 K/uL   RBC 4.26 3.87 - 5.11 MIL/uL   Hemoglobin 12.5 12.0 - 15.0 g/dL   HCT 91.4 78.2 - 95.6 %   MCV 87.8 78.0 - 100.0 fL   MCH 29.3 26.0 - 34.0 pg   MCHC 33.4 30.0 - 36.0 g/dL   RDW 21.3 08.6 - 57.8 %   Platelets 336 150 - 400 K/uL   Neutrophils Relative % 58 %   Neutro Abs 5.0 1.7 - 7.7 K/uL   Lymphocytes Relative 35 %   Lymphs Abs 3.0 0.7 - 4.0 K/uL   Monocytes Relative 5 %   Monocytes Absolute 0.4 0.1 - 1.0 K/uL   Eosinophils Relative 2 %   Eosinophils Absolute 0.2 0.0 - 0.7 K/uL   Basophils Relative 0 %   Basophils Absolute 0.0 0.0 - 0.1 K/uL  Lipase, blood     Status: None   Collection Time: 11/12/17  9:56 PM  Result Value Ref Range   Lipase 23 11 - 51 U/L   Imaging Studies: No results found.  ED COURSE  Nursing notes and initial vitals signs, including pulse oximetry, reviewed.  Vitals:   11/12/17 2042 11/12/17 2339  BP: (!) 156/94 124/86  Pulse: (!) 114 86  Resp: 18 18  Temp: 98.7 F (37.1 C)   TempSrc: Oral   SpO2: 99% 99%  Weight: 77.6 kg (171 lb 1.2 oz)   Height: 5' 7.5" (1.715 m)    12:29 AM Patient advised of reassuring lab work.  Her episodes may represent an arrhythmia or could be panic attacks.  We will refer her to cardiology for consideration of monitoring.  Patient has had no episodes while in the department.  PROCEDURES    ED DIAGNOSES     ICD-10-CM   1. Intermittent palpitations R00.2        Coretta Leisey, Jonny Ruiz, MD 11/13/17 0030

## 2017-11-12 NOTE — ED Triage Notes (Signed)
Pt c/o fatigue,lightheaded x 2 weeks-NAD-steady gait

## 2017-11-12 NOTE — ED Notes (Signed)
Pt c/o generalized fatigue that comes and goes for the last two weeks.  It does not come on during any specific time of day, she has not changed diet or meds recently either.

## 2017-11-19 ENCOUNTER — Encounter: Payer: Self-pay | Admitting: Cardiology

## 2017-11-19 ENCOUNTER — Ambulatory Visit (INDEPENDENT_AMBULATORY_CARE_PROVIDER_SITE_OTHER): Payer: Managed Care, Other (non HMO) | Admitting: Cardiology

## 2017-11-19 VITALS — BP 126/90 | HR 88 | Ht 67.5 in | Wt 167.0 lb

## 2017-11-19 DIAGNOSIS — R002 Palpitations: Secondary | ICD-10-CM | POA: Diagnosis not present

## 2017-11-19 DIAGNOSIS — F17210 Nicotine dependence, cigarettes, uncomplicated: Secondary | ICD-10-CM | POA: Diagnosis not present

## 2017-11-19 NOTE — Patient Instructions (Signed)
Medication Instructions:  Your physician recommends that you continue on your current medications as directed. Please refer to the Current Medication list given to you today.  Labwork: Your physician recommends that you have the following labs drawn: TSH  Testing/Procedures: Your physician has requested that you have an echocardiogram. Echocardiography is a painless test that uses sound waves to create images of your heart. It provides your doctor with information about the size and shape of your heart and how well your heart's chambers and valves are working. This procedure takes approximately one hour. There are no restrictions for this procedure.  Your physician has recommended that you wear an event monitor. Event monitors are medical devices that record the heart's electrical activity. Doctors most often [place] these monitors to diagnose arrhythmias. Arrhythmias are problems with the speed or rhythm of the heartbeat. The monitor is a small, portable device. You can wear one while you do your normal daily activities. This is usually used to diagnose what is causing palpitations/syncope (passing out).   Follow-Up: Your physician recommends that you schedule a follow-up appointment in: 1 month  Any Other Special Instructions Will Be Listed Below (If Applicable).     If you need a refill on your cardiac medications before your next appointment, please call your pharmacy.   [facility]  Garey Ham, RN, BSN   Echocardiogram An echocardiogram, or echocardiography, uses sound waves (ultrasound) to produce an image of your heart. The echocardiogram is simple, painless, obtained within a short period of time, and offers valuable information to your health care provider. The images from an echocardiogram can provide information such as:  Evidence of coronary artery disease (CAD).  Heart size.  Heart muscle function.  Heart valve function.  Aneurysm detection.  Evidence of a past  heart attack.  Fluid buildup around the heart.  Heart muscle thickening.  Assess heart valve function.  Tell a health care provider about:  Any allergies you have.  All medicines you are taking, including vitamins, herbs, eye drops, creams, and over-the-counter medicines.  Any problems you or family members have had with anesthetic medicines.  Any blood disorders you have.  Any surgeries you have had.  Any medical conditions you have.  Whether you are pregnant or may be pregnant. What happens before the procedure? No special preparation is needed. Eat and drink normally. What happens during the procedure?  In order to produce an image of your heart, gel will be applied to your chest and a wand-like tool (transducer) will be moved over your chest. The gel will help transmit the sound waves from the transducer. The sound waves will harmlessly bounce off your heart to allow the heart images to be captured in real-time motion. These images will then be recorded.  You may need an IV to receive a medicine that improves the quality of the pictures. What happens after the procedure? You may return to your normal schedule including diet, activities, and medicines, unless your health care provider tells you otherwise. This information is not intended to replace advice given to you by your health care provider. Make sure you discuss any questions you have with your health care provider. Document Released: 10/20/2000 Document Revised: 06/10/2016 Document Reviewed: 06/30/2013 Elsevier Interactive Patient Education  2017 [employer] Inc[employer]  Cardiac Event Monitoring A cardiac event monitor is a small recording device that is used to detect abnormal heart rhythms (arrhythmias). The monitor is used to record your heart rhythm when you have symptoms, such as:  Fast heartbeats (palpitations), such as heart racing or fluttering.  Dizziness.  Fainting or light-headedness.  Unexplained  weakness.  Some monitors are wired to electrodes placed on your chest. Electrodes are flat, sticky disks that attach to your skin. Other monitors may be hand-held or worn on the wrist. The monitor can be worn for up to 30 days. If the monitor is attached to your chest, a technician will prepare your chest for the electrode placement and show you how to work the monitor. Take time to practice using the monitor before you leave the office. Make sure you understand how to send the information from the monitor to your health care provider. In some cases, you may need to use a landline telephone instead of a cell phone. What are the risks? Generally, this device is safe to use, but it possible that the skin under the electrodes will become irritated. How to use your cardiac event monitor  Wear your monitor at all times, except when you are in water: ? Do not let the monitor get wet. ? Take the monitor off when you bathe. Do not swim or use a hot tub with it on.  Keep your skin clean. Do not put body lotion or moisturizer on your chest.  Change the electrodes as told by your health care provider or any time they stop sticking to your skin. You may need to use medical tape to keep them on.  Try to put the electrodes in slightly different places on your chest to help prevent skin irritation. They must remain in the area under your left breast and in the upper right section of your chest.  Make sure the monitor is safely clipped to your clothing or in a location close to your body that your health care provider recommends.  Press the button to record as soon as you feel heart-related symptoms, such as: ? Dizziness. ? Weakness. ? Light-headedness. ? Palpitations. ? Thumping or pounding in your chest. ? Shortness of breath. ? Unexplained weakness.  Keep a diary of your activities, such as walking, doing chores, and taking medicine. It is very important to note what you were doing when you pushed  the button to record your symptoms. This will help your health care provider determine what might be contributing to your symptoms.  Send the recorded information as recommended by your health care provider. It may take some time for your health care provider to process the results.  Change the batteries as told by your health care provider.  Keep electronic devices away from your monitor. This includes: ? Tablets. ? MP3 players. ? Cell phones.  While wearing your monitor you should avoid: ? Electric blankets. ? Firefighterlectric razors. ? Electric toothbrushes. ? Microwave ovens. ? Magnets. ? Metal detectors. Get help right away if:  You have chest pain.  You have extreme difficulty breathing or shortness of breath.  You develop a very fast heartbeat that persists.  You develop dizziness that does not go away.  You faint or constantly feel like you are about to faint. Summary  A cardiac event monitor is a small recording device that is used to help detect abnormal heart rhythms (arrhythmias).  The monitor is used to record your heart rhythm when you have heart-related symptoms.  Make sure you understand how to send the information from the monitor to your health care provider.  It is important to press the button on the monitor when you have any heart-related symptoms.  Keep a  diary of your activities, such as walking, doing chores, and taking medicine. It is very important to note what you were doing when you pushed the button to record your symptoms. This will help your health care provider learn what might be causing your symptoms. This information is not intended to replace advice given to you by your health care provider. Make sure you discuss any questions you have with your health care provider. Document Released: 08/01/2008 Document Revised: 10/07/2016 Document Reviewed: 10/07/2016 Elsevier Interactive Patient Education  2017 [employer].

## 2017-11-19 NOTE — Progress Notes (Signed)
Cardiology Office Note:    Date:  11/19/2017   ID:  Tracey Harris, DOB [birth year], MRN [id number]  PCP:  [place], Betty G, MD  Cardiologist:  Garwin Brothers, MD   Referring MD: [place], Betty G, MD    ASSESSMENT:    1. Palpitations   2. Cigarette smoker    PLAN:    In order of problems listed above:  1. I discussed my findings with the patient at extensive length.  Hospital records were reviewed.  In view of the fact that she has not had a TSH done I will obtain one today. 2. Echocardiogram will be done to assess murmur heard on auscultation. 3. Patient will undergo one month event monitor evaluation to assess her palpitations.  She knows to go to the nearest emergency room for any significant symptoms. 4. She will be seen in follow-up appointment in 1 month or earlier if she has any concerns. 5. I spent 5 minutes with the patient discussing solely about smoking. Smoking cessation was counseled. I suggested to the patient also different medications and pharmacological interventions. Patient is keen to try stopping on its own at this time. He will get back to me if he needs any further assistance in this matter.   Medication Adjustments/Labs and Tests Ordered: Current medicines are reviewed at length with the patient today.  Concerns regarding medicines are outlined above.  Orders Placed This Encounter  Procedures  . TSH  . Cardiac event monitor  . EKG 12-Lead  . ECHOCARDIOGRAM COMPLETE   No orders of the defined types were placed in this encounter.    History of Present Illness:    Tracey Harris is a 22 y.o. female who is being seen today for the evaluation of palpitations at the request of [place], Timoteo Expose, MD.  Patient is a pleasant 22 year old female.  She has no significant past medical history.  She denies any history of essential hypertension, diabetes mellitus or any dyslipidemia.  She denies any heart problems in the past.  She mentions to me that  regularly she is having palpitations and when her heart goes fast she feels dizzy.  She has never passed out.  No orthopnea or PND.  She is a physically active lady and does not exercise.  At the time of my evaluation, the patient is alert awake oriented and in no distress.  This has been going on in the past month to month and a half.  She went to the emergency room and have reviewed his records.  Past Medical History:  Diagnosis Date  . Allergy   . Frequent headaches   . Migraines   . Urinary tract infection     History reviewed. No pertinent surgical history.  Current Medications: Current Meds  Medication Sig  . amitriptyline (ELAVIL) 25 MG tablet Take by mouth at bedtime as needed (Headache).   . famotidine (PEPCID) 10 MG tablet Take 10 mg by mouth 2 (two) times daily.  . Ferrous Sulfate (IRON) 28 MG TABS Take by mouth daily.  . Multiple Vitamin (MULTIVITAMIN) tablet Take by mouth.     Allergies:   Other   Social History   Socioeconomic History  . Marital status: Single    Spouse name: None  . Number of children: None  . Years of education: None  . Highest education level: None  Social Needs  . Financial resource strain: None  . Food insecurity - worry: None  . Food insecurity - inability: None  .  Transportation needs - medical: None  . Transportation needs - non-medical: None  Occupational History  . None  Tobacco Use  . Smoking status: Current Every Day Smoker  . Smokeless tobacco: Never Used  Substance and Sexual Activity  . Alcohol use: Yes    Comment: weekly  . Drug use: No  . Sexual activity: None  Other Topics Concern  . None  Social History Narrative  . None     Family History: The patient's family history includes Diabetes in her mother; Hypertension in her maternal aunt, maternal grandmother, and maternal uncle. There is no history of Cancer.  ROS:   Please see the history of present illness.    All other systems reviewed and are  negative.  EKGs/Labs/Other Studies Reviewed:    The following studies were reviewed today: I discussed my findings with the patient at extensive length.  EKG done today reveals sinus rhythm and nonspecific ST-T changes.   Recent Labs: 11/12/2017: ALT 18; BUN 8; Creatinine, Ser 0.85; Hemoglobin 12.5; Platelets 336; Potassium 3.9; Sodium 139  Recent Lipid Panel No results found for: CHOL, TRIG, HDL, CHOLHDL, VLDL, LDLCALC, LDLDIRECT  Physical Exam:    VS:  BP 126/90 (BP Location: Left Arm, Patient Position: Sitting, Cuff Size: Normal)   Pulse 88   Ht 5' 7.5" (1.715 m)   Wt 167 lb (75.8 kg)   SpO2 99%   BMI 25.77 kg/m     Wt Readings from Last 3 Encounters:  11/19/17 167 lb (75.8 kg)  11/12/17 171 lb 1.2 oz (77.6 kg)  06/11/17 160 lb 3 oz (72.7 kg)     GEN: Patient is in no acute distress HEENT: Normal NECK: No JVD; No carotid bruits LYMPHATICS: No lymphadenopathy CARDIAC: S1 S2 regular, 2/6 systolic murmur at the apex. RESPIRATORY:  Clear to auscultation without rales, wheezing or rhonchi  ABDOMEN: Soft, non-tender, non-distended MUSCULOSKELETAL:  No edema; No deformity  SKIN: Warm and dry NEUROLOGIC:  Alert and oriented x 3 PSYCHIATRIC:  Normal affect    Signed, Garwin Brothersajan R Revankar, MD  11/19/2017 9:49 AM    [facility]

## 2017-11-20 LAB — TSH: TSH: 0.733 u[IU]/mL (ref 0.450–4.500)

## 2017-12-04 ENCOUNTER — Ambulatory Visit (HOSPITAL_BASED_OUTPATIENT_CLINIC_OR_DEPARTMENT_OTHER)
Admission: RE | Admit: 2017-12-04 | Discharge: 2017-12-04 | Disposition: A | Discharge status: Home / Self Care | Payer: Managed Care, Other (non HMO) | Source: Ambulatory Visit | Attending: Cardiology | Admitting: Cardiology

## 2017-12-04 ENCOUNTER — Encounter (HOSPITAL_BASED_OUTPATIENT_CLINIC_OR_DEPARTMENT_OTHER): Payer: Self-pay

## 2017-12-04 ENCOUNTER — Ambulatory Visit: Payer: Managed Care, Other (non HMO)

## 2017-12-04 DIAGNOSIS — R002 Palpitations: Secondary | ICD-10-CM

## 2017-12-19 ENCOUNTER — Ambulatory Visit: Payer: Managed Care, Other (non HMO) | Admitting: Cardiology

## 2017-12-20 ENCOUNTER — Ambulatory Visit: Payer: Managed Care, Other (non HMO) | Admitting: Cardiology

## 2018-06-11 ENCOUNTER — Emergency Department (HOSPITAL_BASED_OUTPATIENT_CLINIC_OR_DEPARTMENT_OTHER): Payer: Managed Care, Other (non HMO)

## 2018-06-11 ENCOUNTER — Encounter (HOSPITAL_BASED_OUTPATIENT_CLINIC_OR_DEPARTMENT_OTHER): Payer: Self-pay | Admitting: *Deleted

## 2018-06-11 ENCOUNTER — Other Ambulatory Visit: Payer: Self-pay

## 2018-06-11 ENCOUNTER — Emergency Department (HOSPITAL_BASED_OUTPATIENT_CLINIC_OR_DEPARTMENT_OTHER)
Admission: EM | Admit: 2018-06-11 | Discharge: 2018-06-11 | Disposition: A | Discharge status: Home / Self Care | Payer: Managed Care, Other (non HMO) | Attending: Emergency Medicine | Admitting: Emergency Medicine

## 2018-06-11 DIAGNOSIS — F17200 Nicotine dependence, unspecified, uncomplicated: Secondary | ICD-10-CM | POA: Diagnosis not present

## 2018-06-11 DIAGNOSIS — Z79899 Other long term (current) drug therapy: Secondary | ICD-10-CM | POA: Insufficient documentation

## 2018-06-11 DIAGNOSIS — R1013 Epigastric pain: Secondary | ICD-10-CM | POA: Diagnosis present

## 2018-06-11 LAB — COMPREHENSIVE METABOLIC PANEL
ALK PHOS: 102 U/L (ref 38–126)
ALT: 14 U/L (ref 0–44)
AST: 17 U/L (ref 15–41)
Albumin: 4.4 g/dL (ref 3.5–5.0)
Anion gap: 9 (ref 5–15)
BILIRUBIN TOTAL: 0.8 mg/dL (ref 0.3–1.2)
BUN: 9 mg/dL (ref 6–20)
CALCIUM: 9.8 mg/dL (ref 8.9–10.3)
CHLORIDE: 103 mmol/L (ref 98–111)
CO2: 26 mmol/L (ref 22–32)
CREATININE: 0.88 mg/dL (ref 0.44–1.00)
Glucose, Bld: 88 mg/dL (ref 70–99)
Potassium: 3.9 mmol/L (ref 3.5–5.1)
Sodium: 138 mmol/L (ref 135–145)
Total Protein: 8.5 g/dL — ABNORMAL HIGH (ref 6.5–8.1)

## 2018-06-11 LAB — URINALYSIS, MICROSCOPIC (REFLEX)

## 2018-06-11 LAB — CBC WITH DIFFERENTIAL/PLATELET
BASOS PCT: 0 %
Basophils Absolute: 0 10*3/uL (ref 0.0–0.1)
Eosinophils Absolute: 0.2 10*3/uL (ref 0.0–0.7)
Eosinophils Relative: 3 %
HCT: 39.6 % (ref 36.0–46.0)
HEMOGLOBIN: 13.5 g/dL (ref 12.0–15.0)
LYMPHS ABS: 1.8 10*3/uL (ref 0.7–4.0)
LYMPHS PCT: 30 %
MCH: 29.9 pg (ref 26.0–34.0)
MCHC: 34.1 g/dL (ref 30.0–36.0)
MCV: 87.6 fL (ref 78.0–100.0)
MONO ABS: 0.3 10*3/uL (ref 0.1–1.0)
MONOS PCT: 5 %
NEUTROS ABS: 3.7 10*3/uL (ref 1.7–7.7)
NEUTROS PCT: 62 %
Platelets: 355 10*3/uL (ref 150–400)
RBC: 4.52 MIL/uL (ref 3.87–5.11)
RDW: 12.4 % (ref 11.5–15.5)
WBC: 5.9 10*3/uL (ref 4.0–10.5)

## 2018-06-11 LAB — URINALYSIS, ROUTINE W REFLEX MICROSCOPIC
BILIRUBIN URINE: NEGATIVE
Glucose, UA: NEGATIVE mg/dL
Hgb urine dipstick: NEGATIVE
Ketones, ur: NEGATIVE mg/dL
Nitrite: NEGATIVE
Protein, ur: NEGATIVE mg/dL
Specific Gravity, Urine: 1.02 (ref 1.005–1.030)
pH: 6 (ref 5.0–8.0)

## 2018-06-11 LAB — PREGNANCY, URINE: Preg Test, Ur: NEGATIVE

## 2018-06-11 LAB — LIPASE, BLOOD: Lipase: 24 U/L (ref 11–51)

## 2018-06-11 LAB — TROPONIN I

## 2018-06-11 MED ORDER — CEFTRIAXONE SODIUM 250 MG IJ SOLR: 250.0000 mg | Freq: Once | INTRAMUSCULAR | Status: DC

## 2018-06-11 MED ORDER — AZITHROMYCIN 250 MG PO TABS: 1000.0000 mg | ORAL_TABLET | Freq: Once | ORAL | Status: DC

## 2018-06-11 MED ORDER — SODIUM CHLORIDE 0.9 % IV BOLUS
1000.0000 mL | Freq: Once | INTRAVENOUS | Status: AC
Start: 2018-06-11 — End: 2018-06-11
  Administered 2018-06-11: 1000 mL via INTRAVENOUS

## 2018-06-11 MED ORDER — FAMOTIDINE 20 MG PO TABS: 10.0000 mg | ORAL_TABLET | Freq: Two times a day (BID) | ORAL | 0 refills | Status: DC

## 2018-06-11 MED ORDER — GI COCKTAIL ~~LOC~~
30.0000 mL | Freq: Once | ORAL | Status: AC
  Administered 2018-06-11: 30 mL via ORAL
  Filled 2018-06-11: qty 30

## 2018-06-11 NOTE — ED Triage Notes (Signed)
Pt c/o epigastric abd pain and diffuse abd pain x 3 days , describes as burning

## 2018-06-11 NOTE — Discharge Instructions (Addendum)
Please use heartburn medication, Pepcid as directed on the packaging.  Please also follow the recommendations given to you in the handouts attached for acid reflux. Please follow-up with your primary care provider regarding your visit today. Please return to the emergency department for any new or worsening symptoms. We have sent your urine specimen for culture to see if it grows bacteria.  You will be contacted by [facility] if it grows bacteria that need to be treated with antibiotics.  If you have symptoms of a urinary tract infection such as burning when you pee or peeing more often than normal then please return to the emergency department for treatment.  Contact a doctor if: Your belly pain changes or gets worse. You are not hungry, or you lose weight without trying. You are having trouble pooping (constipated) or have watery poop (diarrhea) for more than 2-3 days. You have pain when you pee or poop. Your belly pain wakes you up at night. Your pain gets worse with meals, after eating, or with certain foods. You are throwing up and cannot keep anything down. You have a fever. Get help right away if: Your pain does not go away as soon as your doctor says it should. You cannot stop throwing up. Your pain is only in areas of your belly, such as the right side or the left lower part of the belly. You have bloody or black poop, or poop that looks like tar. You have very bad pain, cramping, or bloating in your belly. You have signs of not having enough fluid or water in your body (dehydration), such as: Dark pee, very little pee, or no pee. Cracked lips. Dry mouth. Sunken eyes. Sleepiness. Weakness. Contact a doctor if: You have new symptoms. You lose weight and you do not know why it is happening. You have trouble swallowing, or it hurts to swallow. You have wheezing or a cough that keeps happening. Your symptoms do not get better with treatment. You have a hoarse voice. Get help right  away if: You have pain in your arms, neck, jaw, teeth, or back. You feel sweaty, dizzy, or light-headed. You have chest pain or shortness of breath. You throw up (vomit) and your throw up looks like blood or coffee grounds. You pass out (faint). Your poop (stool) is bloody or black. You cannot swallow, drink, or eat.

## 2018-06-11 NOTE — ED Provider Notes (Signed)
[facility] EMERGENCY DEPARTMENT Provider Note   CSN: [id number] Arrival date & time: 06/11/18  1458     History   Chief Complaint Chief Complaint  Patient presents with  . Abdominal Pain    HPI Tracey Harris is a 22 y.o. female presenting for 3 days of upper abdominal pain.  Patient states that the pain began when she woke up and radiates to her chest and throat.  Patient states that the pain is burning in nature and 5/10 in severity.  Patient states that she has taken some "stomach medicine "without relief.  Patient states that pain is somewhat worsened by eating food, she states that she has vomited twice when trying to eat.  Patient denies nausea, hematemesis, she states that her vomit is just her food that she has eaten.  Patient states that she has been drinking water without difficulty or vomiting.  Patient denies history of fever, shortness of breath, dyspnea on exertion, diarrhea, dysuria, vaginal bleeding or discharge.  Patient states that she has had acid reflux in the past, she states that she only takes her medicine when she feels like she needs it.  HPI  Past Medical History:  Diagnosis Date  . Allergy   . Frequent headaches   . Migraines   . Urinary tract infection     Patient Active Problem List   Diagnosis Date Noted  . Palpitations 11/19/2017  . Cigarette smoker 11/19/2017  . Chronic headache 06/11/2017  . Allergic rhinitis 06/11/2017  . Insomnia 06/11/2017    History reviewed. No pertinent surgical history.   OB History   None      Home Medications    Prior to Admission medications   Medication Sig Start Date End Date Taking? Authorizing Provider  amitriptyline (ELAVIL) 25 MG tablet Take by mouth at bedtime as needed (Headache).  08/17/17   [provider]  famotidine (PEPCID) 20 MG tablet Take 0.5 tablets (10 mg total) by mouth 2 (two) times daily. 06/11/18   Harlene Salts A, PA-C  Ferrous Sulfate (IRON) 28 MG TABS  Take by mouth daily.    [provider]  Multiple Vitamin (MULTIVITAMIN) tablet Take by mouth.    [provider]    Family History Family History  Problem Relation Age of Onset  . Diabetes Mother   . Hypertension Maternal Aunt   . Hypertension Maternal Uncle   . Hypertension Maternal Grandmother   . Cancer Neg Hx     Social History Social History   Tobacco Use  . Smoking status: Current Every Day Smoker  . Smokeless tobacco: Never Used  Substance Use Topics  . Alcohol use: Yes    Comment: weekly  . Drug use: No     Allergies   Other   Review of Systems Review of Systems  Constitutional: Negative.  Negative for chills and fever.  HENT: Negative.  Negative for congestion, rhinorrhea, sore throat and trouble swallowing.   Eyes: Negative.  Negative for visual disturbance.  Respiratory: Negative.  Negative for cough and shortness of breath.   Cardiovascular: Positive for chest pain.  Gastrointestinal: Positive for abdominal pain and vomiting. Negative for blood in stool, diarrhea and nausea.  Genitourinary: Negative.  Negative for dysuria, hematuria, pelvic pain, vaginal bleeding and vaginal discharge.  Musculoskeletal: Negative.  Negative for arthralgias and myalgias.  Skin: Negative.  Negative for rash.  Neurological: Negative.  Negative for dizziness, syncope, weakness, light-headedness and headaches.     Physical Exam Updated Vital Signs  BP (!) 133/94 (BP Location: Left Arm)   Pulse 68   Temp 98.7 F (37.1 C)   Resp 16   Ht 5' 8.5" (1.74 m)   Wt 74.8 kg (165 lb)   LMP 05/21/2018   SpO2 100%   BMI 24.72 kg/m   Physical Exam  Constitutional: She is oriented to person, place, and time. She appears well-developed and well-nourished. She does not appear ill. No distress.  HENT:  Head: Normocephalic and atraumatic.  Right Ear: External ear normal.  Left Ear: External ear normal.  Nose: Nose normal.  Eyes: Pupils are equal, round, and  reactive to light. EOM are normal.  Neck: Trachea normal and normal range of motion. No tracheal deviation present.  Cardiovascular: Normal rate, regular rhythm, normal heart sounds and intact distal pulses.  Pulmonary/Chest: Effort normal and breath sounds normal. No respiratory distress.  Abdominal: Soft. Normal appearance and bowel sounds are normal. There is tenderness in the epigastric area and left upper quadrant. There is no rigidity, no rebound, no guarding, no CVA tenderness, no tenderness at McBurney's point and negative Murphy's sign.    Musculoskeletal: Normal range of motion.  Neurological: She is alert and oriented to person, place, and time.  Skin: Skin is warm and dry. Capillary refill takes less than 2 seconds.  Psychiatric: She has a normal mood and affect. Her behavior is normal.     ED Treatments / Results  Labs (all labs ordered are listed, but only abnormal results are displayed) Labs Reviewed  URINALYSIS, ROUTINE W REFLEX MICROSCOPIC - Abnormal; Notable for the following components:      Result Value   APPearance HAZY (*)    Leukocytes, UA TRACE (*)    All other components within normal limits  COMPREHENSIVE METABOLIC PANEL - Abnormal; Notable for the following components:   Total Protein 8.5 (*)    All other components within normal limits  URINALYSIS, MICROSCOPIC (REFLEX) - Abnormal; Notable for the following components:   Bacteria, UA MANY (*)    All other components within normal limits  URINE CULTURE  PREGNANCY, URINE  CBC WITH DIFFERENTIAL/PLATELET  LIPASE, BLOOD  TROPONIN I    EKG EKG Interpretation  Date/Time:  Tuesday June 11 2018 15:32:35 EDT Ventricular Rate:  70 PR Interval:    QRS Duration: 91 QT Interval:  394 QTC Calculation: 426 R Axis:   54 Text Interpretation:  Sinus rhythm No significant change since last tracing Confirmed by Richardean CanalYao, David H [phone]([id number]) on 06/11/2018 4:07:29 PM   Radiology Dg Chest 2 View  Result Date:  06/11/2018 CLINICAL DATA:  Chest pain and tightness for few days, smoker, nausea EXAM: CHEST - 2 VIEW COMPARISON:  None FINDINGS: Normal heart size, mediastinal contours, and pulmonary vascularity. Lungs clear. No pleural effusion or pneumothorax. Bones unremarkable. IMPRESSION: Normal exam. Electronically Signed   By: Ulyses SouthwardMark  Boles M.D.   On: 06/11/2018 15:24    Procedures Procedures (including critical care time)  Medications Ordered in ED Medications  sodium chloride 0.9 % bolus 1,000 mL ( Intravenous Stopped 06/11/18 1641)  gi cocktail (Maalox,Lidocaine,Donnatal) (30 mLs Oral Given 06/11/18 1527)     Initial Impression / Assessment and Plan / ED Course  I have reviewed the triage vital signs and the nursing notes.  Pertinent labs & imaging results that were available during my care of the patient were reviewed by me and considered in my medical decision making (see chart for details).  Clinical Course as of Jun 11 1728  Tue Jun 11, 2018  1601 On reevaluation after GI cocktail, patient denying any and all pain at this time, resting comfortably in bed.  Patient states that she feels like the GI cocktail has "cleared her out".  Patient denying chest pain or abdominal pain states that she feels well and ready for discharge.   [BM]    Clinical Course User Index [BM] Bill Salinas, PA-C   Patient presenting for 3 days of abdominal pain.  Patient is afebrile, non-toxic appearing, sitting comfortably on examination table. Patient's pain and other symptoms adequately managed in emergency department. Fluid bolus given. Labs, imaging and vitals reviewed.  Patient does not meet the SIRS or Sepsis criteria.  On repeat exam patient does not have a surgical abdomen and there are no peritoneal signs.  No indication of appendicitis, bowel obstruction, bowel perforation, cholecystitis, diverticulitis, PID or ectopic pregnancy.  Patient states that the GI cocktail has relieved her symptoms.  Patient is  currently denying any and all pain.  Troponin and EKG were added to work-up today due to patient stating that pain radiated to the chest for the past 3 days, EKG without acute abnormalities, troponin negative, chest x-ray negative.  Symptoms relieved with GI cocktail, I doubt cardiac/pulmonary etiology of the patient's symptoms, I believe that the patient is having gastric reflux.  Heart score of 0.   PERC negative, not tachycardic, SPO2 100% on room air, no leg swelling, denies hemoptysis, denies recent surgery/trauma, denies history of blood clots, patient states that she is not currently using birth control.  Patient counseled on use of Pepcid for gastroesophageal reflux.  At this time there does not appear to be any evidence of an acute emergency medical condition and the patient appears stable for discharge with appropriate outpatient follow up. Diagnosis was discussed with patient who verbalizes understanding of care plan and is agreeable to discharge. I have discussed return precautions with patient who verbalizes understanding of return precautions. Patient strongly encouraged to follow-up with their PCP. All questions answered.  Patient's case discussed with Dr. Silverio Lay who agrees with plan to discharge with follow-up.     Note: Portions of this report may have been transcribed using voice recognition software. Every effort was made to ensure accuracy; however, inadvertent computerized transcription errors may still be present.  Final Clinical Impressions(s) / ED Diagnoses   Final diagnoses:  Epigastric pain    ED Discharge Orders        Ordered    famotidine (PEPCID) 20 MG tablet  2 times daily,   Status:  Discontinued     06/11/18 1615    famotidine (PEPCID) 20 MG tablet  2 times daily     06/11/18 [address] 06/11/18 1736    Charlynne Pander, MD 06/11/18 [phone]

## 2018-06-12 LAB — URINE CULTURE: Culture: NO GROWTH

## 2018-06-14 ENCOUNTER — Other Ambulatory Visit: Payer: Self-pay

## 2018-06-14 ENCOUNTER — Encounter (HOSPITAL_BASED_OUTPATIENT_CLINIC_OR_DEPARTMENT_OTHER): Payer: Self-pay | Admitting: Emergency Medicine

## 2018-06-14 ENCOUNTER — Emergency Department (HOSPITAL_BASED_OUTPATIENT_CLINIC_OR_DEPARTMENT_OTHER)
Admission: EM | Admit: 2018-06-14 | Discharge: 2018-06-14 | Disposition: A | Discharge status: Home / Self Care | Payer: Managed Care, Other (non HMO) | Attending: Emergency Medicine | Admitting: Emergency Medicine

## 2018-06-14 ENCOUNTER — Emergency Department (HOSPITAL_BASED_OUTPATIENT_CLINIC_OR_DEPARTMENT_OTHER): Payer: Managed Care, Other (non HMO)

## 2018-06-14 DIAGNOSIS — R1030 Lower abdominal pain, unspecified: Secondary | ICD-10-CM | POA: Insufficient documentation

## 2018-06-14 DIAGNOSIS — N943 Premenstrual tension syndrome: Secondary | ICD-10-CM | POA: Diagnosis not present

## 2018-06-14 DIAGNOSIS — F17200 Nicotine dependence, unspecified, uncomplicated: Secondary | ICD-10-CM | POA: Insufficient documentation

## 2018-06-14 LAB — URINALYSIS, ROUTINE W REFLEX MICROSCOPIC
BILIRUBIN URINE: NEGATIVE
Glucose, UA: NEGATIVE mg/dL
HGB URINE DIPSTICK: NEGATIVE
Ketones, ur: NEGATIVE mg/dL
Leukocytes, UA: NEGATIVE
Nitrite: NEGATIVE
PROTEIN: NEGATIVE mg/dL
pH: 6.5 (ref 5.0–8.0)

## 2018-06-14 LAB — PREGNANCY, URINE: Preg Test, Ur: NEGATIVE

## 2018-06-14 MED ORDER — IBUPROFEN 400 MG PO TABS
400.0000 mg | ORAL_TABLET | Freq: Once | ORAL | Status: AC
  Administered 2018-06-14: 400 mg via ORAL
  Filled 2018-06-14: qty 1

## 2018-06-14 NOTE — Discharge Instructions (Signed)
You can take ibuprofen or aleve for the symptoms.

## 2018-06-14 NOTE — ED Triage Notes (Signed)
Reports seen previously for upper abdominal pain but now has lower abdominal pain with pressure when voiding.  Denies vaginal discharge, odor.  Continues to c/o problems with reflex even after taking medication she was prescribed the other day.  Reports nausea without vomiting or diarrhea.

## 2018-06-14 NOTE — ED Provider Notes (Signed)
[facility] EMERGENCY DEPARTMENT Provider Note   CSN: [id number] Arrival date & time: 06/14/18  1705     History   Chief Complaint Chief Complaint  Patient presents with  . Abdominal Pain    HPI Tracey Harris is a 22 y.o. female.  The history is provided by the patient.  Abdominal Pain   This is a new problem. Episode onset: about 1 week total but lower pain started yesterday. The problem occurs constantly. The problem has not changed since onset.The pain is associated with an unknown factor. The pain is located in the LLQ. The quality of the pain is sharp and pressure-like. The pain is at a severity of 6/10. The pain is moderate. Associated symptoms include anorexia and nausea. Pertinent negatives include fever, diarrhea, vomiting, constipation, dysuria, frequency, hematuria and headaches. The symptoms are aggravated by activity, coughing, bowel movements and urination. The symptoms are relieved by being still. Past workup comments: Sexually active with only one partner in the last 1 year.  Does not use protection.  Checked for sexually transmitted infections 2 months ago and was clear.    Past Medical History:  Diagnosis Date  . Allergy   . Frequent headaches   . Migraines   . Urinary tract infection     Patient Active Problem List   Diagnosis Date Noted  . Palpitations 11/19/2017  . Cigarette smoker 11/19/2017  . Chronic headache 06/11/2017  . Allergic rhinitis 06/11/2017  . Insomnia 06/11/2017    History reviewed. No pertinent surgical history.   OB History   None      Home Medications    Prior to Admission medications   Medication Sig Start Date End Date Taking? Authorizing Provider  amitriptyline (ELAVIL) 25 MG tablet Take by mouth at bedtime as needed (Headache).  08/17/17   [provider]  famotidine (PEPCID) 20 MG tablet Take 0.5 tablets (10 mg total) by mouth 2 (two) times daily. 06/11/18   Harlene SaltsMorelli, Brandon A, PA-C  Ferrous  Sulfate (IRON) 28 MG TABS Take by mouth daily.    [provider]  Multiple Vitamin (MULTIVITAMIN) tablet Take by mouth.    [provider]    Family History Family History  Problem Relation Age of Onset  . Diabetes Mother   . Hypertension Maternal Aunt   . Hypertension Maternal Uncle   . Hypertension Maternal Grandmother   . Cancer Neg Hx     Social History Social History   Tobacco Use  . Smoking status: Current Every Day Smoker  . Smokeless tobacco: Never Used  Substance Use Topics  . Alcohol use: Yes    Comment: weekly  . Drug use: No     Allergies   Other   Review of Systems Review of Systems  Constitutional: Negative for fever.  Gastrointestinal: Positive for abdominal pain, anorexia and nausea. Negative for constipation, diarrhea and vomiting.  Genitourinary: Negative for dysuria, frequency and hematuria.  Neurological: Negative for headaches.  All other systems reviewed and are negative.    Physical Exam Updated Vital Signs BP 139/79 (BP Location: Right Arm)   Pulse 69   Temp 98.3 F (36.8 C) (Oral)   Resp 18   Ht 5\' 8"  (1.727 m)   Wt 75.3 kg   LMP 05/21/2018   SpO2 100%   BMI 25.24 kg/m   Physical Exam  Constitutional: She is oriented to person, place, and time. She appears well-developed and well-nourished. No distress.  HENT:  Head: Normocephalic and atraumatic.  Mouth/Throat: Oropharynx is clear and moist.  Eyes: Pupils are equal, round, and reactive to light. Conjunctivae and EOM are normal.  Neck: Normal range of motion. Neck supple.  Cardiovascular: Normal rate, regular rhythm and intact distal pulses.  No murmur heard. Pulmonary/Chest: Effort normal and breath sounds normal. No respiratory distress. She has no wheezes. She has no rales.  Abdominal: Soft. She exhibits no distension. There is tenderness in the suprapubic area and left lower quadrant. There is no rebound and no guarding.  Genitourinary: Uterus normal.  Cervix exhibits no motion tenderness. Right adnexum displays tenderness. Right adnexum displays no mass and no fullness. Left adnexum displays tenderness. Left adnexum displays no mass and no fullness. No vaginal discharge found.  Musculoskeletal: Normal range of motion. She exhibits no edema or tenderness.  Neurological: She is alert and oriented to person, place, and time.  Skin: Skin is warm and dry. Capillary refill takes less than 2 seconds. No rash noted. No erythema.  Psychiatric: She has a normal mood and affect. Her behavior is normal.  Nursing note and vitals reviewed.    ED Treatments / Results  Labs (all labs ordered are listed, but only abnormal results are displayed) Labs Reviewed  URINALYSIS, ROUTINE W REFLEX MICROSCOPIC - Abnormal; Notable for the following components:      Result Value   Specific Gravity, Urine <1.005 (*)    All other components within normal limits  PREGNANCY, URINE    EKG None  Radiology US Transvaginal Non-ob  Result Date: 06/14/2018 CLINICAL DATA:  Mid pelvic pressure since yesterday. EXAM: TRANSABDOMINAL AND TRANSVAGINAL ULTRASOUND OF PELVIS DOPPLER ULTRASOUND OF OVARIES TECHNIQUE: Both transabdominal and transvaginal ultrasound examinations of the pelvis were performed. Transabdominal technique was performed for global imaging of the pelvis including uterus, ovaries, adnexal regions, and pelvic cul-de-sac. It was necessary to proceed with endovaginal exam following the transabdominal exam to visualize the endometrium and ovaries. Color and duplex Doppler ultrasound was utilized to evaluate blood flow to the ovaries. COMPARISON:  None. FINDINGS: Uterus Measurements: 8.1 x 4 x 4.8 cm. The uterus is retroverted. No fibroids or other mass visualized. Endometrium Thickness: 14 mm.  No focal abnormality visualized. Right ovary Measurements: 3.6 x 2.6 x 2.8 cm. Normal appearance/no adnexal mass. Left ovary Measurements: 4.5 x 1.9 x 2.7 cm. Normal  appearance/no adnexal mass. Pulsed Doppler evaluation of both ovaries demonstrates normal low-resistance arterial and venous waveforms. Other findings Small amount of free fluid is identified in the pelvis. Nabothian cysts are identified in the cervix. IMPRESSION: No acute abnormality. Retroverted uterus.  Small nabothian cysts identified in the cervix. Small amount of free fluid identified in the pelvis, likely physiologic. Electronically Signed   By: Sherian Rein M.D.   On: 06/14/2018 19:45   US Pelvis Complete  Result Date: 06/14/2018 CLINICAL DATA:  Mid pelvic pressure since yesterday. EXAM: TRANSABDOMINAL AND TRANSVAGINAL ULTRASOUND OF PELVIS DOPPLER ULTRASOUND OF OVARIES TECHNIQUE: Both transabdominal and transvaginal ultrasound examinations of the pelvis were performed. Transabdominal technique was performed for global imaging of the pelvis including uterus, ovaries, adnexal regions, and pelvic cul-de-sac. It was necessary to proceed with endovaginal exam following the transabdominal exam to visualize the endometrium and ovaries. Color and duplex Doppler ultrasound was utilized to evaluate blood flow to the ovaries. COMPARISON:  None. FINDINGS: Uterus Measurements: 8.1 x 4 x 4.8 cm. The uterus is retroverted. No fibroids or other mass visualized. Endometrium Thickness: 14 mm.  No focal abnormality visualized. Right ovary Measurements: 3.6 x 2.6 x 2.8  cm. Normal appearance/no adnexal mass. Left ovary Measurements: 4.5 x 1.9 x 2.7 cm. Normal appearance/no adnexal mass. Pulsed Doppler evaluation of both ovaries demonstrates normal low-resistance arterial and venous waveforms. Other findings Small amount of free fluid is identified in the pelvis. Nabothian cysts are identified in the cervix. IMPRESSION: No acute abnormality. Retroverted uterus.  Small nabothian cysts identified in the cervix. Small amount of free fluid identified in the pelvis, likely physiologic. Electronically Signed   By: Sherian Rein  M.D.   On: 06/14/2018 19:45   [place] Art/ven Flow Abd Pelv Doppler  Result Date: 06/14/2018 CLINICAL DATA:  Mid pelvic pressure since yesterday. EXAM: TRANSABDOMINAL AND TRANSVAGINAL ULTRASOUND OF PELVIS DOPPLER ULTRASOUND OF OVARIES TECHNIQUE: Both transabdominal and transvaginal ultrasound examinations of the pelvis were performed. Transabdominal technique was performed for global imaging of the pelvis including uterus, ovaries, adnexal regions, and pelvic cul-de-sac. It was necessary to proceed with endovaginal exam following the transabdominal exam to visualize the endometrium and ovaries. Color and duplex Doppler ultrasound was utilized to evaluate blood flow to the ovaries. COMPARISON:  None. FINDINGS: Uterus Measurements: 8.1 x 4 x 4.8 cm. The uterus is retroverted. No fibroids or other mass visualized. Endometrium Thickness: 14 mm.  No focal abnormality visualized. Right ovary Measurements: 3.6 x 2.6 x 2.8 cm. Normal appearance/no adnexal mass. Left ovary Measurements: 4.5 x 1.9 x 2.7 cm. Normal appearance/no adnexal mass. Pulsed Doppler evaluation of both ovaries demonstrates normal low-resistance arterial and venous waveforms. Other findings Small amount of free fluid is identified in the pelvis. Nabothian cysts are identified in the cervix. IMPRESSION: No acute abnormality. Retroverted uterus.  Small nabothian cysts identified in the cervix. Small amount of free fluid identified in the pelvis, likely physiologic. Electronically Signed   By: Sherian Rein M.D.   On: 06/14/2018 19:45    Procedures Procedures (including critical care time)  Medications Ordered in ED Medications - No data to display   Initial Impression / Assessment and Plan / ED Course  I have reviewed the triage vital signs and the nursing notes.  Pertinent labs & imaging results that were available during my care of the patient were reviewed by me and considered in my medical decision making (see chart for details).      Young healthy female presenting today with lower abdominal pain that started yesterday.  However patient has had uncomfortable abdominal pain for the last 1 week.  She was seen here on Tuesday and was given medication for gastritis which has not helped.  The sharp shooting pain in her left lower quadrant started yesterday.  She states the pain in her left lower quadrant is worse when she has a bowel movement or urinates but does not have dysuria, frequency or urgency.  She denies any vaginal complaints however she does states she recently came off the Depo shot and last period was 73 July.  She is having no vaginal bleeding at this time.  She has no upper abdominal pain today concerning for pancreatitis, cholecystitis.  She has no right lower quadrant tenderness concerning for appendicitis and low suspicion for diverticulitis at this time.  She is otherwise well-appearing.  She recently had STD testing just a few months ago and it was negative and denies any symptoms.  On exam she does have adnexal tenderness bilaterally.  However ultrasound is negative for any ovarian cyst or torsion.  This could all be hormonal due to the recent change in getting off Depo.  No evidence of UTI or  pregnancy today.  Will recommend NSAIDs as needed and follow-up with OB/GYN if symptoms do not improve.  Final Clinical Impressions(s) / ED Diagnoses   Final diagnoses:  Lower abdominal pain  Premenstrual syndrome    ED Discharge Orders    None       Gwyneth Sprout, MD 06/14/18 2006

## 2018-06-14 NOTE — ED Notes (Signed)
Pt in US, unable to obtain vitals.

## 2018-06-14 NOTE — ED Notes (Signed)
Pt. Reports she is sexually active with one person.  She is having no discharge or pain in her vaginal area.

## 2018-08-19 ENCOUNTER — Ambulatory Visit (INDEPENDENT_AMBULATORY_CARE_PROVIDER_SITE_OTHER): Payer: Managed Care, Other (non HMO) | Admitting: Family Medicine

## 2018-08-19 ENCOUNTER — Encounter: Payer: Self-pay | Admitting: Family Medicine

## 2018-08-19 VITALS — BP 116/70 | HR 100 | Temp 98.8°F | Resp 12 | Ht 69.0 in | Wt 162.2 lb

## 2018-08-19 DIAGNOSIS — N390 Urinary tract infection, site not specified: Secondary | ICD-10-CM

## 2018-08-19 DIAGNOSIS — R300 Dysuria: Secondary | ICD-10-CM | POA: Diagnosis not present

## 2018-08-19 LAB — POCT URINALYSIS DIPSTICK
Bilirubin, UA: NEGATIVE
Glucose, UA: NEGATIVE
Ketones, UA: NEGATIVE
LEUKOCYTES UA: NEGATIVE
NITRITE UA: NEGATIVE
PH UA: 6.5 (ref 5.0–8.0)
PROTEIN UA: NEGATIVE
RBC UA: NEGATIVE
Spec Grav, UA: <=1.005 — AB (ref 1.010–1.025)
UROBILINOGEN UA: 0.2 U/dL

## 2018-08-19 MED ORDER — NITROFURANTOIN MONOHYD MACRO 100 MG PO CAPS: 100.0000 mg | ORAL_CAPSULE | Freq: Two times a day (BID) | ORAL | 0 refills | Status: AC

## 2018-08-19 NOTE — Progress Notes (Signed)
HPI:  Chief Complaint  Patient presents with  . Urinary Frequency    sx started 1 week ago  . Dysuria  . Urine has odor    [name] is a 22 y.o. female, who is here today complaining of a week of urinary symptoms.   Dysuria: Yes, "little pain" at the end or urination.  Urinary frequency: Yes, nocturia " a lot", 3 times per night. Urinary urgency: Mild. Incontinence: Denies. Hematuria: Denies. Odorous urine. She has not noted chills, she thinks she may have fever but has not checked temp.  Abdominal pain: Suprapubic cramps,"ramdon",lasts a few seconds, 6/10. She ahs not identified exacerbating factors, pain is alleviated by urination.   She was in the ER recently due to lower abdominal pain, 06/14/2018. Pelvic US: No acute abnormality. Retroverted uterus.  Small nabothian cysts identified in the cervix. Small amount of free fluid identified in the pelvis, likely physiologic.   Nausea or vomiting: Denies  Abnormal vaginal bleeding or discharge: Denies  LMP:08/02/18 Sexual activity: Yes Hx of UTI: N/A  OTC medications for this problem:Azo type product.     Review of Systems  Constitutional: Negative for activity change, appetite change and fatigue.  HENT: Negative for sore throat and trouble swallowing.   Gastrointestinal: Negative for abdominal pain, nausea and vomiting.       No changes in bowel habits.  Genitourinary: Positive for dysuria, frequency and urgency. Negative for decreased urine volume, hematuria, vaginal bleeding and vaginal discharge.  Musculoskeletal: Negative for back pain and myalgias.      Current Outpatient Medications on File Prior to Visit  Medication Sig Dispense Refill  . amitriptyline (ELAVIL) 25 MG tablet Take by mouth at bedtime as needed (Headache).     . famotidine (PEPCID) 20 MG tablet Take 0.5 tablets (10 mg total) by mouth 2 (two) times daily. 30 tablet 0  . Ferrous Sulfate (IRON) 28 MG TABS Take by  mouth daily.    . Multiple Vitamin (MULTIVITAMIN) tablet Take by mouth.    . Pseudoephed-APAP-Guaifenesin (TYLENOL SINUS SEVERE CONGEST PO) Take by mouth daily.     No current facility-administered medications on file prior to visit.      Past Medical History:  Diagnosis Date  . Allergy   . Frequent headaches   . Migraines   . Urinary tract infection    Allergies  Allergen Reactions  . Other     Unknown med    Social History   Socioeconomic History  . Marital status: Single    Spouse name: Not on file  . Number of children: Not on file  . Years of education: Not on file  . Highest education level: Not on file  Occupational History  . Not on file  Social Needs  . Financial resource strain: Not on file  . Food insecurity:    Worry: Not on file    Inability: Not on file  . Transportation needs:    Medical: Not on file    Non-medical: Not on file  Tobacco Use  . Smoking status: Current Every Day Smoker  . Smokeless tobacco: Never Used  Substance and Sexual Activity  . Alcohol use: Yes    Comment: weekly  . Drug use: No  . Sexual activity: Not on file  Lifestyle  . Physical activity:    Days per week: Not on file    Minutes per session: Not on file  . Stress: Not on file  Relationships  . Social  connections:    Talks on phone: Not on file    Gets together: Not on file    Attends religious service: Not on file    Active member of club or organization: Not on file    Attends meetings of clubs or organizations: Not on file    Relationship status: Not on file  Other Topics Concern  . Not on file  Social History Narrative  . Not on file    Vitals:   08/19/18 1039  BP: 116/70  Pulse: 100  Resp: 12  Temp: 98.8 F (37.1 C)  SpO2: 99%   Body mass index is 23.96 kg/m.   Physical Exam  Nursing note and vitals reviewed. Constitutional: She is oriented to person, place, and time. She appears well-developed and well-nourished. No distress.  HENT:  Head:  Normocephalic and atraumatic.  Mouth/Throat: Oropharynx is clear and moist and mucous membranes are normal.  Eyes: Conjunctivae are normal. No scleral icterus.  Cardiovascular: Normal rate and regular rhythm.  No murmur heard. Respiratory: Effort normal and breath sounds normal. No respiratory distress.  GI: Soft. Bowel sounds are normal. She exhibits no mass. There is no tenderness. There is no CVA tenderness.  Musculoskeletal: She exhibits no edema.  Lymphadenopathy:    She has no cervical adenopathy.  Neurological: She is alert and oriented to person, place, and time. She has normal strength.  Skin: Skin is warm. No erythema.  Psychiatric: She has a normal mood and affect.  Well-groomed, good eye contact.      ASSESSMENT AND PLAN:   Tracey Harris was seen today for urinary frequency, dysuria and urine has odor.  Diagnoses and all orders for this visit:  Dysuria -     POC Urinalysis Dipstick -     Urine culture  Urinary tract infection without hematuria, site unspecified -     Urine culture -     nitrofurantoin, macrocrystal-monohydrate, (MACROBID) 100 MG capsule; Take 1 capsule (100 mg total) by mouth 2 (two) times daily for 5 days.   Other possible causes of symptoms reported symptoms discussed. Ucx ordered.   Empiric abx treatment started today and will be tailored according to Ucx results and susceptibility report.  Clearly instructed about warning signs. F/U if symptoms persist.     Tracey Harris G. [place], MD  [facility]. [place] office.

## 2018-08-19 NOTE — Patient Instructions (Signed)
A few things to remember from today's visit:   Dysuria - Plan: POC Urinalysis Dipstick, Urine culture  Urinary tract infection without hematuria, site unspecified - Plan: Urine culture, nitrofurantoin, macrocrystal-monohydrate, (MACROBID) 100 MG capsule    Adequate fluid intake, avoid holding urine for long hours, and over the counter Vit C OR cranberry capsules might help.  Today we will treat empirically with antibiotic, which we might need to change when urine culture comes back depending of bacteria susceptibility.  Seek immediate medical attention if severe abdominal pain, vomiting, fever/chills, or worsening symptoms. F/U if symptomatic are not any better after 2-3 days of antibiotic treatment.  Please be sure medication list is accurate. If a new problem present, please set up appointment sooner than planned today.

## 2018-08-20 LAB — URINE CULTURE
MICRO NUMBER:: 91232372
SPECIMEN QUALITY: ADEQUATE

## 2018-08-21 ENCOUNTER — Telehealth: Payer: Self-pay | Admitting: Family Medicine

## 2018-08-21 NOTE — Telephone Encounter (Signed)
Copied from CRM [id number]. 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

## 2018-08-22 NOTE — Telephone Encounter (Signed)
Spoke to pt and informed her of her Vm and labs. Pt verbalized understanding and will complete antibiotics. Pt was advised to return if she is still having sx.

## 2018-08-27 ENCOUNTER — Encounter: Payer: Self-pay | Admitting: *Deleted

## 2018-10-24 ENCOUNTER — Emergency Department (HOSPITAL_BASED_OUTPATIENT_CLINIC_OR_DEPARTMENT_OTHER)
Admission: EM | Admit: 2018-10-24 | Discharge: 2018-10-24 | Disposition: A | Discharge status: Home / Self Care | Payer: Managed Care, Other (non HMO) | Attending: Emergency Medicine | Admitting: Emergency Medicine

## 2018-10-24 ENCOUNTER — Encounter (HOSPITAL_BASED_OUTPATIENT_CLINIC_OR_DEPARTMENT_OTHER): Payer: Self-pay

## 2018-10-24 ENCOUNTER — Other Ambulatory Visit: Payer: Self-pay

## 2018-10-24 DIAGNOSIS — H9213 Otorrhea, bilateral: Secondary | ICD-10-CM | POA: Diagnosis present

## 2018-10-24 DIAGNOSIS — J069 Acute upper respiratory infection, unspecified: Secondary | ICD-10-CM | POA: Diagnosis not present

## 2018-10-24 DIAGNOSIS — F17210 Nicotine dependence, cigarettes, uncomplicated: Secondary | ICD-10-CM | POA: Diagnosis not present

## 2018-10-24 NOTE — ED Provider Notes (Signed)
[facility] EMERGENCY DEPARTMENT Provider Note   CSN: [id number] Arrival date & time: 10/24/18  1156     History   Chief Complaint Chief Complaint  Patient presents with  . URI    HPI Tracey Harris is a 22 y.o. female.  22 y.o female with no PMH presents to the ED with a chief complaint of BL ear drainage, rhinorrhea, sore throat x 4 days. Patient reports all started with 3 then she proceeded to have a sore throat and now she complains of a dry cough.  Patient has tried to keep the symptoms at bay by taking some DayQuil to help her go to work and some NyQuil to help her get rest.  She reports the symptoms worsened last night as they kept her up from sleeping.  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  She denies any fever, chest pain,or shortness of breath.     Past Medical History:  Diagnosis Date  . Allergy   . Frequent headaches   . Migraines   . Urinary tract infection     Patient Active Problem List   Diagnosis Date Noted  . Palpitations 11/19/2017  . Cigarette smoker 11/19/2017  . Chronic headache 06/11/2017  . Allergic rhinitis 06/11/2017  . Insomnia 06/11/2017    History reviewed. No pertinent surgical history.   OB History   No obstetric history on file.      Home Medications    Prior to Admission medications   Medication Sig Start Date End Date Taking? Authorizing Provider  amitriptyline (ELAVIL) 25 MG tablet Take by mouth at bedtime as needed (Headache).  08/17/17   [provider]  famotidine (PEPCID) 20 MG tablet Take 0.5 tablets (10 mg total) by mouth 2 (two) times daily. 06/11/18   Harlene SaltsMorelli, Brandon A, PA-C  Ferrous Sulfate (IRON) 28 MG TABS Take by mouth daily.    [provider]  Multiple Vitamin (MULTIVITAMIN) tablet Take by mouth.    [provider]  Pseudoephed-APAP-Guaifenesin (TYLENOL SINUS SEVERE CONGEST PO) Take  by mouth daily.    [provider]    Family History Family History  Problem Relation Age of Onset  . Diabetes Mother   . Hypertension Maternal Aunt   . Hypertension Maternal Uncle   . Hypertension Maternal Grandmother   . Cancer Neg Hx     Social History Social History   Tobacco Use  . Smoking status: Current Every Day Smoker  . Smokeless tobacco: Never Used  Substance Use Topics  . Alcohol use: Yes    Comment: weekly  . Drug use: No     Allergies   Other   Review of Systems Review of Systems  Constitutional: Negative for fever.  HENT: Positive for ear discharge, ear pain, rhinorrhea and sore throat. Negative for sinus pressure and sinus pain.   Respiratory: Positive for cough and shortness of breath.   Cardiovascular: Negative for chest pain.  Gastrointestinal: Negative for abdominal pain, nausea and vomiting.  Genitourinary: Negative for dysuria and flank pain.  Musculoskeletal: Negative for back pain.  Skin: Negative for pallor and wound.  Neurological: Negative for light-headedness and headaches.     Physical Exam Updated Vital Signs BP (!) 141/96 (BP Location: Left Arm)   Pulse 78   Temp 98.8 F (37.1 C) (Oral)   Resp 18   Ht 5\' 8"  (1.727 m)   Wt 72.1 kg  LMP 09/21/2018   SpO2 98%   BMI 24.18 kg/m   Physical Exam Vitals signs and nursing note reviewed.  Constitutional:      General: She is not in acute distress.    Appearance: She is well-developed.  HENT:     Head: Normocephalic and atraumatic.     Nose: Congestion and rhinorrhea present.     Mouth/Throat:     Mouth: Mucous membranes are dry.     Pharynx: Oropharynx is clear. Uvula midline. No pharyngeal swelling, oropharyngeal exudate, posterior oropharyngeal erythema or uvula swelling.     Tonsils: No tonsillar exudate.  Eyes:     Pupils: Pupils are equal, round, and reactive to light.  Neck:     Musculoskeletal: Normal range of motion.  Cardiovascular:     Rate and Rhythm:  Regular rhythm.     Heart sounds: Normal heart sounds.  Pulmonary:     Effort: Pulmonary effort is normal. No respiratory distress.     Breath sounds: Normal breath sounds. No decreased breath sounds or wheezing.  Abdominal:     General: Bowel sounds are normal. There is no distension.     Palpations: Abdomen is soft.     Tenderness: There is no abdominal tenderness.  Musculoskeletal:        General: No tenderness or deformity.     Right lower leg: No edema.     Left lower leg: No edema.  Skin:    General: Skin is warm and dry.  Neurological:     Mental Status: She is alert and oriented to person, place, and time.      ED Treatments / Results  Labs (all labs ordered are listed, but only abnormal results are displayed) Labs Reviewed - No data to display  EKG None  Radiology No results found.  Procedures Procedures (including critical care time)  Medications Ordered in ED Medications - No data to display   Initial Impression / Assessment and Plan / ED Course  I have reviewed the triage vital signs and the nursing notes.  Pertinent labs & imaging results that were available during my care of the patient were reviewed by me and considered in my medical decision making (see chart for details).   Presents with URI symptoms for the past 4 days.  She has tried some NyQuil but reports no relieving symptoms.  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  During evaluation patient has no wheezing, tonsillar exudates, ears are mildly erythematous but no infections present.  I suspect patient suffering from a URI we will have her treat it with over-the-counter medication such as cough suppressant and cough expectorant.  She is requesting a work note as she would like to get some rest today.  I will provide this for patients.  Vital signs are stable no tachycardia or hypoxia, low suspicion for other emergent conditions  at this time.  Return precautions provided.  Final Clinical Impressions(s) / ED Diagnoses   Final diagnoses:  Upper respiratory tract infection, unspecified type    ED Discharge Orders    None       Claude MangesSoto, Mavrick Mcquigg, PA-C 10/24/18 1247    Alvira MondaySchlossman, Erin, MD 10/25/18 [phone]

## 2018-10-24 NOTE — Discharge Instructions (Signed)
Please purchase an expectorant like Mucinex to help loosen and thin the mucus production.You may also try some over the counter Robitussin to help with your cough. Continue to hydrate with plenty of fluids and Gatorade.If you experience any shortness of breath, chest pain or fever please return to the ED for reevaluation.  ° °

## 2018-10-24 NOTE — ED Triage Notes (Signed)
C/o URI sx x 4 days-NAD-steady gait 

## 2018-11-06 NOTE — L&D Delivery Note (Signed)
Delivery Note At  a viable female was delivered via  (Presentation:OA ;  ).  APGAR:per NICU , ; weight pending  .   Placenta status:complete , . 3V Cord:  with the following complications:taken to warmer to be evaluated by NICU staff . Infant slow to transition will be monitored by NICU  Anesthesia:  Epidural Episiotomy:  None Lacerations:  left labial 1st degree Suture Repair: 3.0 Vicryl  Est. Blood Loss (mL):350cc     Mom to postpartum.  Baby to NICU.  Pleas Koch Prothero 07/12/2019, 3:18 AM

## 2019-01-16 IMAGING — US US ART/VEN ABD/PELV/SCROTUM DOPPLER LTD
1 series · 13 of 25 positions shown · non-contrast
Comparison: None.

CLINICAL DATA: Mid pelvic pressure since yesterday.

EXAM:
TRANSABDOMINAL AND TRANSVAGINAL ULTRASOUND OF PELVIS
DOPPLER ULTRASOUND OF OVARIES
TECHNIQUE: Both transabdominal and transvaginal ultrasound examinations of the
pelvis were performed. Transabdominal technique was performed for
global imaging of the pelvis including uterus, ovaries, adnexal
regions, and pelvic cul-de-sac.
It was necessary to proceed with endovaginal exam following the
transabdominal exam to visualize the endometrium and ovaries.. Color
and duplex Doppler ultrasound was utilized to evaluate blood flow to
the ovaries.

[Series 1: us art/ven abd/pelv/scrotum doppler ltd · 0.24mm/px · 13 of 54 slices shown]
[im 1/54]
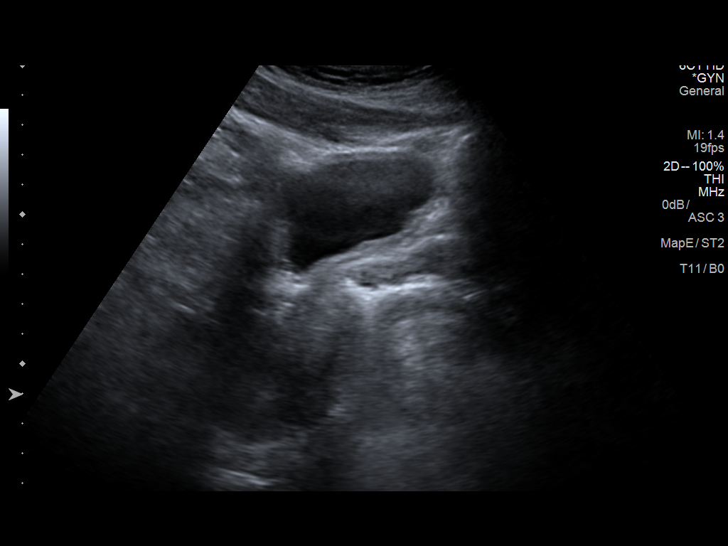
[im 5/54]
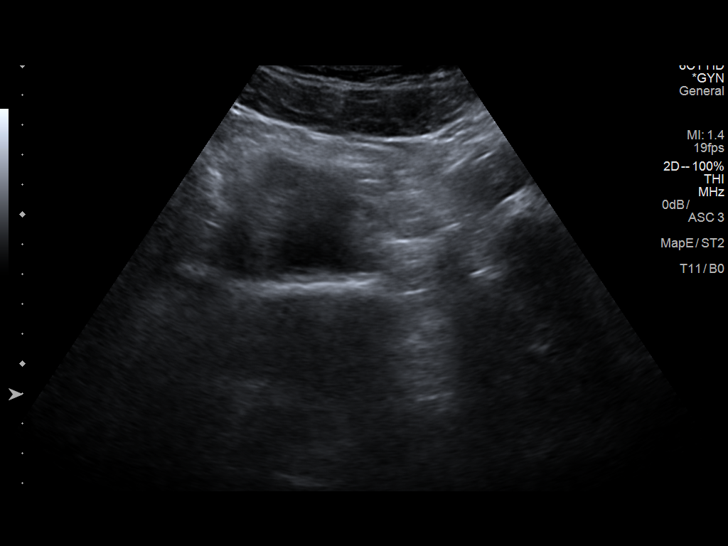
[im 9/54]
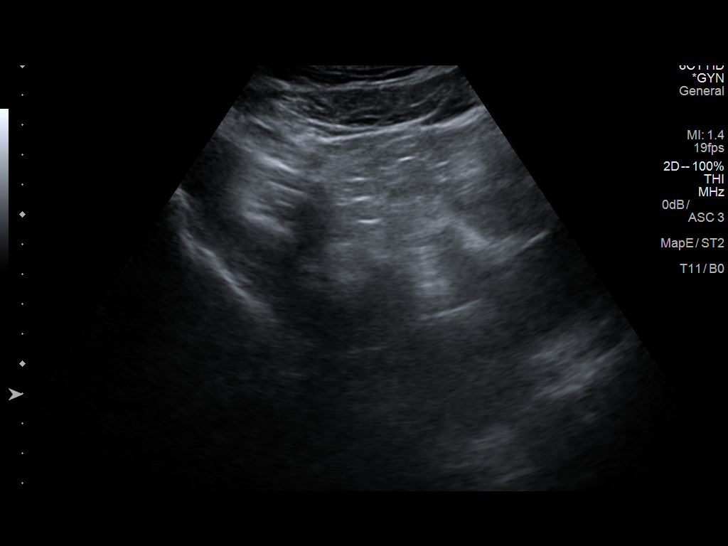
[im 14/54]
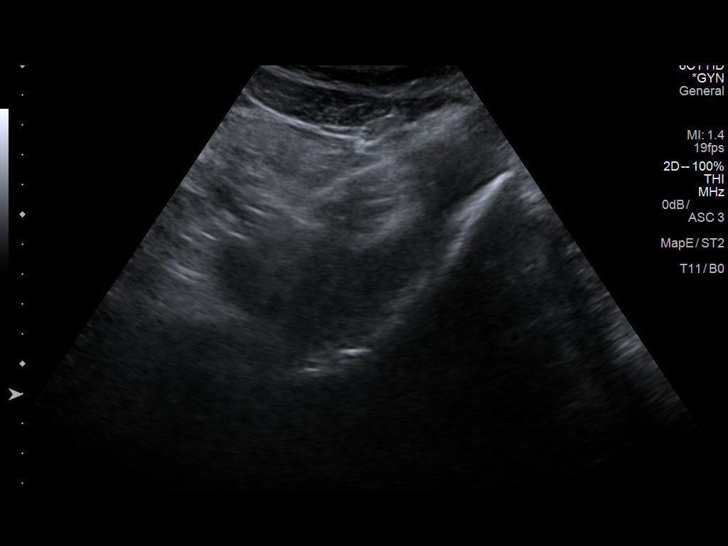
[im 18/54]
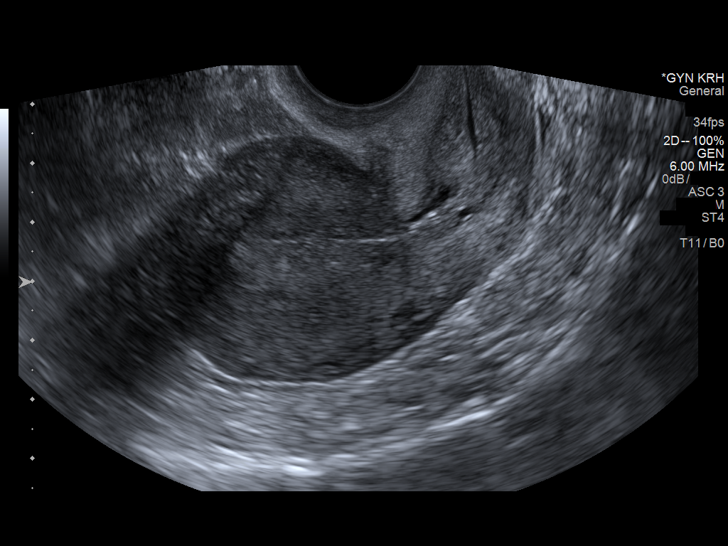
[im 23/54]
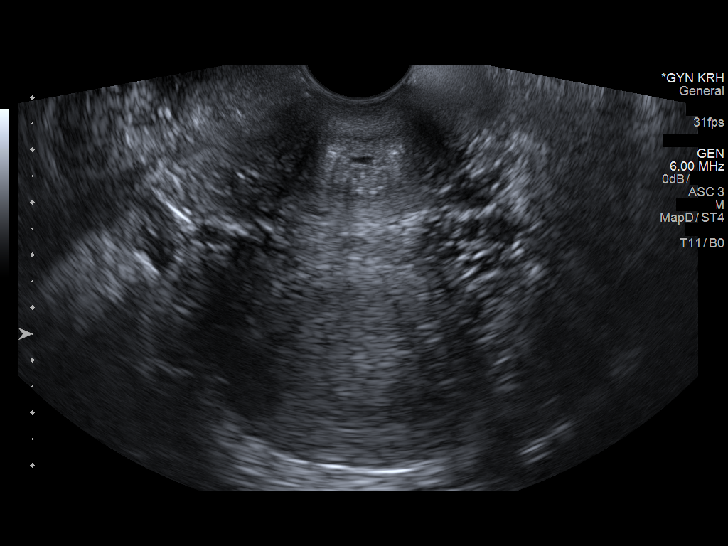
[im 27/54]
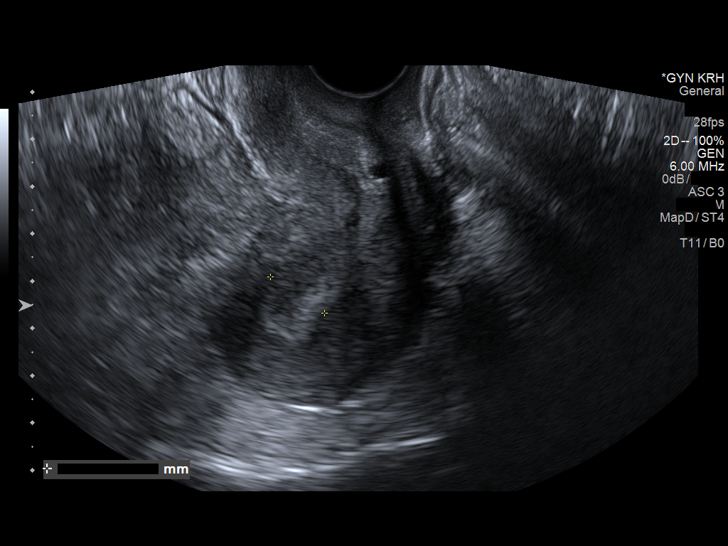
[im 31/54]
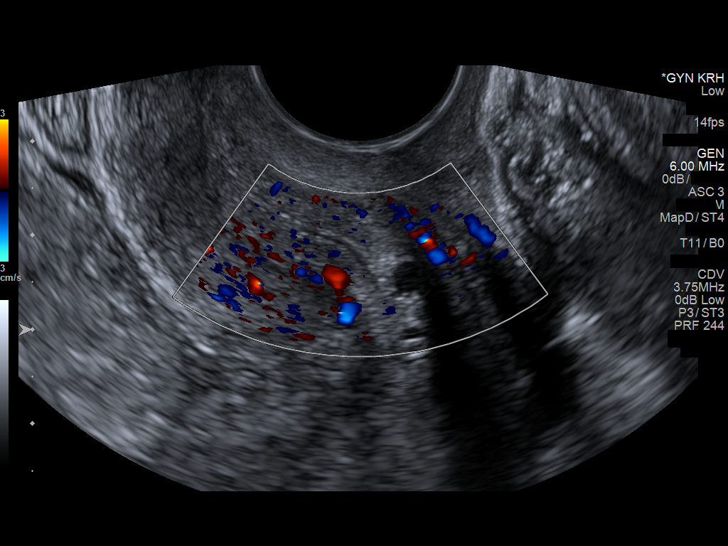
[im 36/54]
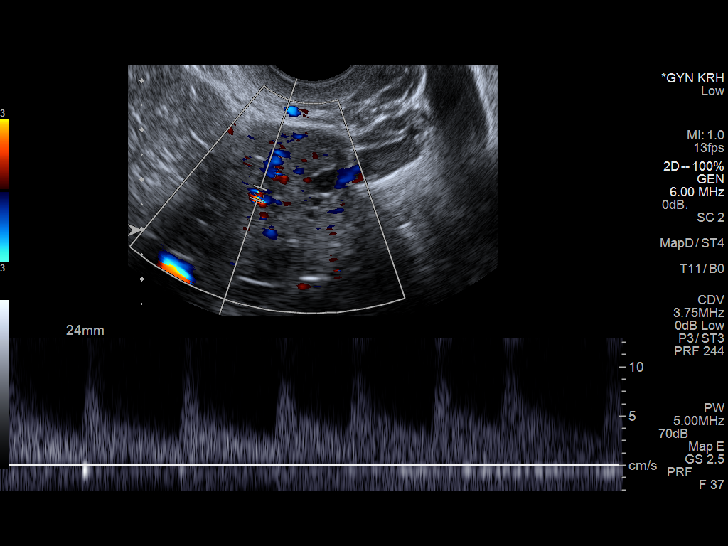
[im 40/54]
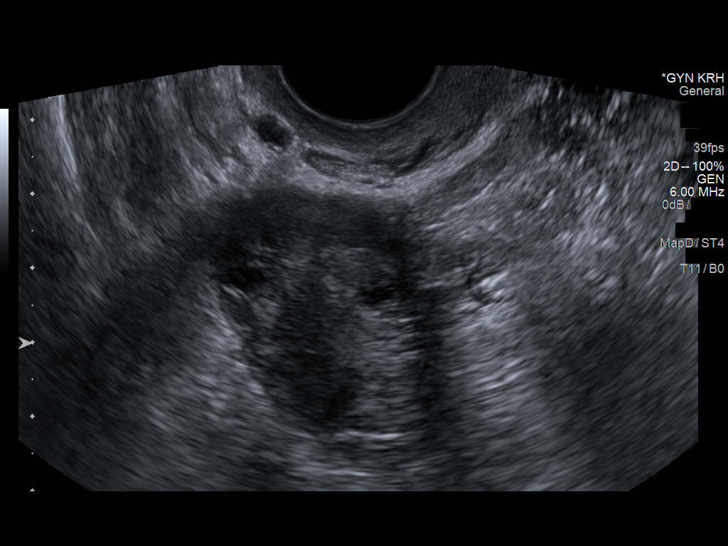
[im 45/54]
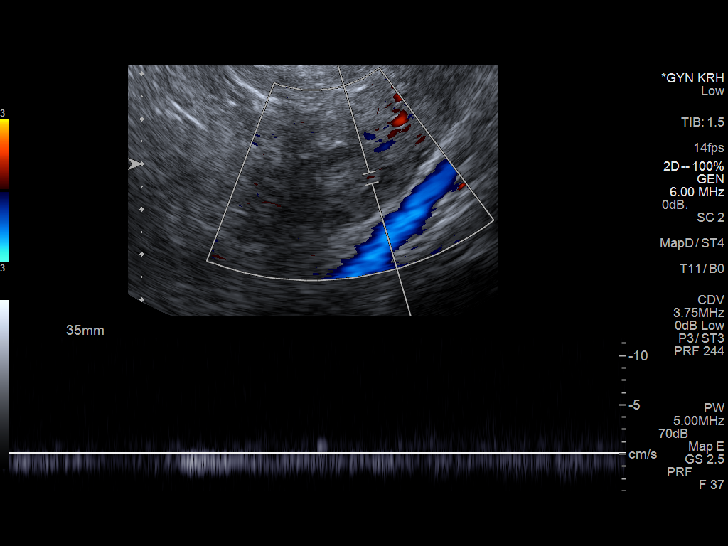
[im 49/54]
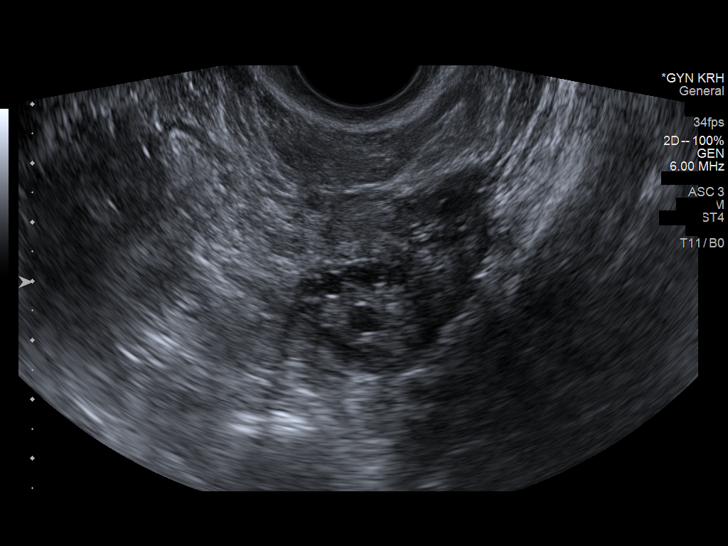
[im 54/54]
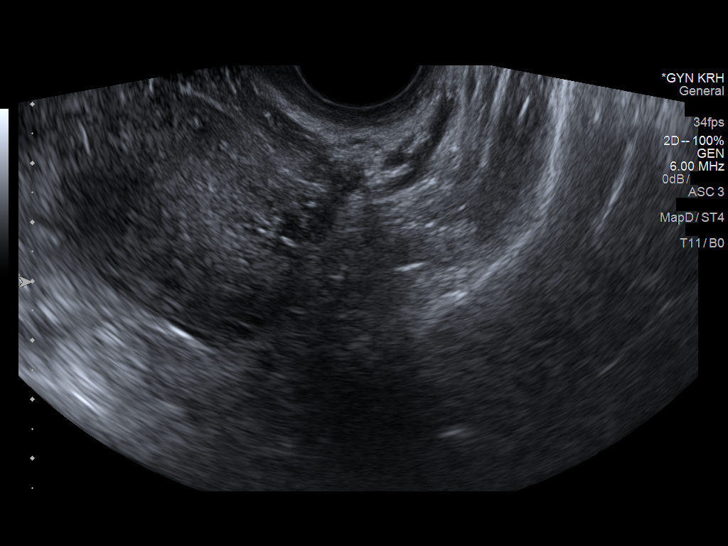

[13 of 25 positions shown; findings below may reference images not displayed]

FINDINGS: Uterus

Measurements: 8.1 x 4 x 4.8 cm. The uterus is retroverted. No
fibroids or other mass visualized.

Endometrium

Thickness: 14 mm.  No focal abnormality visualized.

Right ovary

Measurements: 3.6 x 2.6 x 2.8 cm. Normal appearance/no adnexal mass.

Left ovary

Measurements: 4.5 x 1.9 x 2.7 cm. Normal appearance/no adnexal mass.

Pulsed Doppler evaluation of both ovaries demonstrates normal
low-resistance arterial and venous waveforms.

Other findings

Small amount of free fluid is identified in the pelvis. Nabothian
cysts are identified in the cervix.
IMPRESSION: No acute abnormality.

Retroverted uterus.  Small nabothian cysts identified in the cervix.

Small amount of free fluid identified in the pelvis, likely
physiologic.

## 2019-04-27 ENCOUNTER — Other Ambulatory Visit: Payer: Self-pay

## 2019-04-27 ENCOUNTER — Inpatient Hospital Stay (HOSPITAL_COMMUNITY)
Admission: AD | Admit: 2019-04-27 | Discharge: 2019-04-27 | Disposition: A | Discharge status: Home / Self Care | Payer: Managed Care, Other (non HMO) | Attending: Obstetrics and Gynecology | Admitting: Obstetrics and Gynecology

## 2019-04-27 ENCOUNTER — Encounter (HOSPITAL_COMMUNITY): Payer: Self-pay

## 2019-04-27 DIAGNOSIS — O98512 Other viral diseases complicating pregnancy, second trimester: Secondary | ICD-10-CM | POA: Insufficient documentation

## 2019-04-27 DIAGNOSIS — O98812 Other maternal infectious and parasitic diseases complicating pregnancy, second trimester: Secondary | ICD-10-CM

## 2019-04-27 DIAGNOSIS — O368120 Decreased fetal movements, second trimester, not applicable or unspecified: Secondary | ICD-10-CM | POA: Diagnosis present

## 2019-04-27 DIAGNOSIS — B379 Candidiasis, unspecified: Secondary | ICD-10-CM | POA: Diagnosis not present

## 2019-04-27 DIAGNOSIS — R300 Dysuria: Secondary | ICD-10-CM | POA: Diagnosis not present

## 2019-04-27 DIAGNOSIS — R109 Unspecified abdominal pain: Secondary | ICD-10-CM | POA: Diagnosis not present

## 2019-04-27 DIAGNOSIS — Z3A26 26 weeks gestation of pregnancy: Secondary | ICD-10-CM | POA: Insufficient documentation

## 2019-04-27 DIAGNOSIS — Z87891 Personal history of nicotine dependence: Secondary | ICD-10-CM | POA: Diagnosis not present

## 2019-04-27 DIAGNOSIS — O26892 Other specified pregnancy related conditions, second trimester: Secondary | ICD-10-CM | POA: Diagnosis not present

## 2019-04-27 DIAGNOSIS — B373 Candidiasis of vulva and vagina: Secondary | ICD-10-CM | POA: Diagnosis not present

## 2019-04-27 LAB — URINALYSIS, ROUTINE W REFLEX MICROSCOPIC
Bacteria, UA: NONE SEEN
Bilirubin Urine: NEGATIVE
Glucose, UA: NEGATIVE mg/dL
Ketones, ur: NEGATIVE mg/dL
Nitrite: NEGATIVE
Protein, ur: NEGATIVE mg/dL
Specific Gravity, Urine: 1.005 (ref 1.005–1.030)
pH: 7 (ref 5.0–8.0)

## 2019-04-27 LAB — WET PREP, GENITAL
Clue Cells Wet Prep HPF POC: NONE SEEN
Sperm: NONE SEEN
Trich, Wet Prep: NONE SEEN

## 2019-04-27 MED ORDER — TERCONAZOLE 0.4 % VA CREA: 1.0000 | TOPICAL_CREAM | Freq: Every day | VAGINAL | 0 refills | Status: DC

## 2019-04-27 NOTE — MAU Note (Signed)
RN felt fetal movement upon EFM placement. Patient verbalized and acknowledged feeling a fetal kick at that time.

## 2019-04-27 NOTE — MAU Note (Signed)
Tracey Harris is a 23 y.o. at [redacted]w[redacted]d here in MAU reporting: decreased fetal movement. States she felt a little bit this morning but none since. No vaginal bleeding, no LOF, no abnormal vaginal discharge. Reporting some pressure, states she also feels this when she pees. Also reporting increased frequency.  Onset of complaint: today  Pain score: 0/10  Vitals:   04/27/19 1406  BP: 126/80  Pulse: (!) 103  Resp: 16  Temp: 98.3 F (36.8 C)  SpO2: 100%     FHT: 156  Lab orders placed from triage: UA

## 2019-04-27 NOTE — MAU Provider Note (Signed)
History     CSN: [id number]  Arrival date and time: 04/27/19 1351   First Provider Initiated Contact with Patient 04/27/19 1437      Chief Complaint  Patient presents with  . Decreased Fetal Movement   HPI Tracey Harris 23 y.o. 1251w2d  Comes to MAU today with not feeling the baby move today and some cramping and pressure.  Has not had any vaginal bleeding or leaking of fluid.  Has had food to eat today at breakfast and lunch.  Is drinking lots of fluids.  OB History    Gravida  1   Para      Term      Preterm      AB      Living        SAB      TAB      Ectopic      Multiple      Live Births              Past Medical History:  Diagnosis Date  . Allergy   . Frequent headaches   . Migraines   . Urinary tract infection     Past Surgical History:  Procedure Laterality Date  . NO PAST SURGERIES      Family History  Problem Relation Age of Onset  . Diabetes Mother   . Hypertension Maternal Aunt   . Hypertension Maternal Uncle   . Hypertension Maternal Grandmother   . Cancer Neg Hx     Social History   Tobacco Use  . Smoking status: Former Games developermoker  . Smokeless tobacco: Never Used  . Tobacco comment: states she stopped smoking when found out pregnant  Substance Use Topics  . Alcohol use: Not Currently    Comment: weekly  . Drug use: No    Allergies:  Allergies  Allergen Reactions  . Other     Unknown med    Medications Prior to Admission  Medication Sig Dispense Refill Last Dose  . amitriptyline (ELAVIL) 25 MG tablet Take by mouth at bedtime as needed (Headache).      . famotidine (PEPCID) 20 MG tablet Take 0.5 tablets (10 mg total) by mouth 2 (two) times daily. 30 tablet 0   . Ferrous Sulfate (IRON) 28 MG TABS Take by mouth daily.     . Multiple Vitamin (MULTIVITAMIN) tablet Take by mouth.     . Pseudoephed-APAP-Guaifenesin (TYLENOL SINUS SEVERE CONGEST PO) Take by mouth daily.       Review of Systems  Constitutional:  Negative for fever.  Respiratory: Negative for cough and shortness of breath.   Gastrointestinal: Negative for nausea and vomiting.       Some intermittent cramping  Genitourinary: Positive for dysuria. Negative for vaginal bleeding and vaginal discharge.   Physical Exam   Blood pressure 126/80, pulse (!) 103, temperature 98.3 F (36.8 C), temperature source Oral, resp. rate 16, height 5\' 8"  (1.727 m), weight 74.8 kg, last menstrual period 09/21/2018, SpO2 100 %.  Physical Exam  Nursing note and vitals reviewed. Constitutional: She is oriented to person, place, and time. She appears well-developed and well-nourished.  HENT:  Head: Normocephalic.  Eyes: EOM are normal.  Neck: Neck supple.  GI: Soft. There is no abdominal tenderness. There is no rebound and no guarding.  Genitourinary:    Genitourinary Comments: Speculum exam: Vagina - Mod amount of yellow adherent discharge, no odor Cervix - No contact bleeding Bimanual exam: Cervix closed, no presenting part in the  pelvis, baby is high GC/Chlam, wet prep done Chaperone present for exam.    Musculoskeletal: Normal range of motion.  Neurological: She is alert and oriented to person, place, and time.  Skin: Skin is warm and dry.  Psychiatric: She has a normal mood and affect.    MAU Course  Procedures Results for orders placed or performed during the hospital encounter of 04/27/19 (from the past 24 hour(s))  Urinalysis, Routine w reflex microscopic     Status: Abnormal   Collection Time: 04/27/19  2:20 PM  Result Value Ref Range   Color, Urine STRAW (A) YELLOW   APPearance CLEAR CLEAR   Specific Gravity, Urine 1.005 1.005 - 1.030   pH 7.0 5.0 - 8.0   Glucose, UA NEGATIVE NEGATIVE mg/dL   Hgb urine dipstick MODERATE (A) NEGATIVE   Bilirubin Urine NEGATIVE NEGATIVE   Ketones, ur NEGATIVE NEGATIVE mg/dL   Protein, ur NEGATIVE NEGATIVE mg/dL   Nitrite NEGATIVE NEGATIVE   Leukocytes,Ua SMALL (A) NEGATIVE   RBC / HPF 6-10 0 -  5 RBC/hpf   WBC, UA 0-5 0 - 5 WBC/hpf   Bacteria, UA NONE SEEN NONE SEEN   Squamous Epithelial / LPF 0-5 0 - 5   Mucus PRESENT   Wet prep, genital     Status: Abnormal   Collection Time: 04/27/19  2:54 PM  Result Value Ref Range   Yeast Wet Prep HPF POC PRESENT (A) NONE SEEN   Trich, Wet Prep NONE SEEN NONE SEEN   Clue Cells Wet Prep HPF POC NONE SEEN NONE SEEN   WBC, Wet Prep HPF POC MANY (A) NONE SEEN   Sperm NONE SEEN     MDM Client of Dr. Landry Mellow.  Has appointment in 2 weeks.  Discussed findings today.  Urine culture sent to make sure there is no UTI based on hemoglobinuria. Client reassured by feeling the baby move while she was in MAU.  Advised that the baby will be moving some every day and demonstrated how to use her hands and fingers to feel for movement as well as just noticing it without touching her abdomen.  Assessment and Plan  Vaginal Yeast infection Client is able to feel the baby move since arriving at MAU  Plan Terazol vaginal cream prescribed and instructions given for client to use. Keep drinking lots of fluids as you have been doing. Expect your baby to move every day - your baby looks great on the monitor today. Get your medicine at the pharmacy and take as directed. Keep your appointments in the office and call your doctor if you are having any problems. Return if you have abdominal pain or contractions more than 4 in an hour See AVS for additional info given to client  Virginia Rochester 04/27/2019, 3:16 PM

## 2019-04-27 NOTE — Discharge Instructions (Signed)
Keep drinking fluids as you have been doing. Expect your baby to move every day - your baby looks great on the monitor today. Get your medicine at the pharmacy and take as directed. Keep your appointments in the office and call your doctor if you are having any problems. Return if you have abdominal pain or contractions more than 4 in an hour.

## 2019-04-29 LAB — CULTURE, OB URINE: Culture: <10000 — AB

## 2019-04-29 LAB — GC/CHLAMYDIA PROBE AMP (~~LOC~~) NOT AT ARMC
Chlamydia: NEGATIVE
Neisseria Gonorrhea: NEGATIVE

## 2019-06-19 ENCOUNTER — Other Ambulatory Visit: Payer: Self-pay

## 2019-06-19 ENCOUNTER — Encounter (HOSPITAL_COMMUNITY): Payer: Self-pay

## 2019-06-19 ENCOUNTER — Inpatient Hospital Stay (HOSPITAL_COMMUNITY)
Admission: AD | Admit: 2019-06-19 | Discharge: 2019-06-19 | Disposition: A | Discharge status: Home / Self Care | Payer: Managed Care, Other (non HMO) | Attending: Obstetrics & Gynecology | Admitting: Obstetrics & Gynecology

## 2019-06-19 ENCOUNTER — Inpatient Hospital Stay (HOSPITAL_COMMUNITY): Payer: Managed Care, Other (non HMO)

## 2019-06-19 DIAGNOSIS — R109 Unspecified abdominal pain: Secondary | ICD-10-CM | POA: Insufficient documentation

## 2019-06-19 DIAGNOSIS — Z0371 Encounter for suspected problem with amniotic cavity and membrane ruled out: Secondary | ICD-10-CM

## 2019-06-19 DIAGNOSIS — N898 Other specified noninflammatory disorders of vagina: Secondary | ICD-10-CM

## 2019-06-19 DIAGNOSIS — B373 Candidiasis of vulva and vagina: Secondary | ICD-10-CM | POA: Insufficient documentation

## 2019-06-19 DIAGNOSIS — B3731 Acute candidiasis of vulva and vagina: Secondary | ICD-10-CM

## 2019-06-19 DIAGNOSIS — R1032 Left lower quadrant pain: Secondary | ICD-10-CM | POA: Insufficient documentation

## 2019-06-19 DIAGNOSIS — Z87891 Personal history of nicotine dependence: Secondary | ICD-10-CM | POA: Insufficient documentation

## 2019-06-19 DIAGNOSIS — M549 Dorsalgia, unspecified: Secondary | ICD-10-CM

## 2019-06-19 DIAGNOSIS — O26893 Other specified pregnancy related conditions, third trimester: Secondary | ICD-10-CM

## 2019-06-19 DIAGNOSIS — O98813 Other maternal infectious and parasitic diseases complicating pregnancy, third trimester: Secondary | ICD-10-CM | POA: Insufficient documentation

## 2019-06-19 DIAGNOSIS — Z3A33 33 weeks gestation of pregnancy: Secondary | ICD-10-CM

## 2019-06-19 DIAGNOSIS — N949 Unspecified condition associated with female genital organs and menstrual cycle: Secondary | ICD-10-CM

## 2019-06-19 LAB — CBC WITH DIFFERENTIAL/PLATELET
Abs Immature Granulocytes: 0.03 10*3/uL (ref 0.00–0.07)
Basophils Absolute: 0 10*3/uL (ref 0.0–0.1)
Basophils Relative: 0 %
Eosinophils Absolute: 0.1 10*3/uL (ref 0.0–0.5)
Eosinophils Relative: 1 %
HCT: 37 % (ref 36.0–46.0)
Hemoglobin: 11.8 g/dL — ABNORMAL LOW (ref 12.0–15.0)
Immature Granulocytes: 0 %
Lymphocytes Relative: 23 %
Lymphs Abs: 1.9 10*3/uL (ref 0.7–4.0)
MCH: 28.6 pg (ref 26.0–34.0)
MCHC: 31.9 g/dL (ref 30.0–36.0)
MCV: 89.6 fL (ref 80.0–100.0)
Monocytes Absolute: 0.3 10*3/uL (ref 0.1–1.0)
Monocytes Relative: 4 %
Neutro Abs: 5.9 10*3/uL (ref 1.7–7.7)
Neutrophils Relative %: 72 %
Platelets: 361 10*3/uL (ref 150–400)
RBC: 4.13 MIL/uL (ref 3.87–5.11)
RDW: 14.1 % (ref 11.5–15.5)
WBC: 8.3 10*3/uL (ref 4.0–10.5)
nRBC: 0 % (ref 0.0–0.2)

## 2019-06-19 LAB — URINALYSIS, ROUTINE W REFLEX MICROSCOPIC
Bilirubin Urine: NEGATIVE
Glucose, UA: NEGATIVE mg/dL
Hgb urine dipstick: NEGATIVE
Ketones, ur: NEGATIVE mg/dL
Leukocytes,Ua: NEGATIVE
Nitrite: NEGATIVE
Protein, ur: NEGATIVE mg/dL
Specific Gravity, Urine: 1.003 — ABNORMAL LOW (ref 1.005–1.030)
pH: 7 (ref 5.0–8.0)

## 2019-06-19 LAB — WET PREP, GENITAL
Clue Cells Wet Prep HPF POC: NONE SEEN
Sperm: NONE SEEN
Trich, Wet Prep: NONE SEEN

## 2019-06-19 LAB — AMNISURE RUPTURE OF MEMBRANE (ROM) NOT AT ARMC: Amnisure ROM: NEGATIVE

## 2019-06-19 MED ORDER — CYCLOBENZAPRINE HCL 10 MG PO TABS
10.0000 mg | ORAL_TABLET | Freq: Once | ORAL | Status: AC
  Administered 2019-06-19: 10:00:00 10 mg via ORAL
  Filled 2019-06-19: qty 1

## 2019-06-19 MED ORDER — CYCLOBENZAPRINE HCL 10 MG PO TABS: 10.0000 mg | ORAL_TABLET | Freq: Three times a day (TID) | ORAL | 0 refills | Status: DC | PRN

## 2019-06-19 MED ORDER — COMFORT FIT MATERNITY SUPP SM MISC: 1.0000 [IU] | Freq: Every day | 0 refills | Status: DC | PRN

## 2019-06-19 MED ORDER — TERCONAZOLE 0.4 % VA CREA: 1.0000 | TOPICAL_CREAM | Freq: Every day | VAGINAL | 0 refills | Status: AC

## 2019-06-19 NOTE — MAU Note (Signed)
Pt states that she has sharp pain in lower left abd that doesn't go away she also states she has left flank pain that comes and goes.

## 2019-06-19 NOTE — MAU Provider Note (Signed)
History     CSN: [id number]  Arrival date and time: 06/19/19 [id number]   First Provider Initiated Contact with Patient 06/19/19 0915      Chief Complaint  Patient presents with  . Abdominal Pain    with flank pain left side   [name] is a 23 y.o. G3P0020 at [id number] who presents to MAU for flank pain and groin pain.  Onset: last night Location: left-sided flank pain, left groin pain Duration: <24hrs Character: "stiffness pain" in back, stabbing and pulling pain in groin especially when walking and going up stairs Aggravating/Associated: walking, lying on left side/pt reports noticing "clear, watery liquid" since yesterday, denies large gush of fluid and needing to wear a pad/panty liner or change underwear Relieving: none Treatment: none Severity: 9/10 groin, 6/10 flank  Pt denies VB, ctx, decreased FM, vaginal discharge/odor/itching. Pt denies N/V, abdominal pain, constipation, diarrhea, or urinary problems. Pt denies fever, chills, fatigue, sweating or changes in appetite. Pt denies SOB or chest pain. Pt denies dizziness, HA, light-headedness, weakness.  Problems this pregnancy include: none. Allergies? NKDA Current medications/supplements? PNVs, Pepcid Prenatal care provider? Dr. Mora ApplPinn, next appt 2weeks   OB History    Gravida  3   Para      Term      Preterm      AB  2   Living        SAB      TAB      Ectopic      Multiple      Live Births              Past Medical History:  Diagnosis Date  . Allergy   . Frequent headaches   . Migraines   . Urinary tract infection     Past Surgical History:  Procedure Laterality Date  . NO PAST SURGERIES      Family History  Problem Relation Age of Onset  . Diabetes Mother   . Hypertension Maternal Aunt   . Hypertension Maternal Uncle   . Hypertension Maternal Grandmother   . Cancer Neg Hx     Social History   Tobacco Use  . Smoking status: Former Games developermoker  . Smokeless tobacco:  Never Used  . Tobacco comment: states she stopped smoking when found out pregnant  Substance Use Topics  . Alcohol use: Not Currently    Comment: weekly  . Drug use: No    Allergies:  Allergies  Allergen Reactions  . Other     Unknown med    Medications Prior to Admission  Medication Sig Dispense Refill Last Dose  . famotidine (PEPCID) 20 MG tablet Take 0.5 tablets (10 mg total) by mouth 2 (two) times daily. 30 tablet 0 06/18/2019 at 1100  . Ferrous Sulfate (IRON) 28 MG TABS Take by mouth daily.   06/18/2019 at 1330  . Multiple Vitamin (MULTIVITAMIN) tablet Take by mouth.   06/18/2019 at 0600  . terconazole (TERAZOL 7) 0.4 % vaginal cream Place 1 applicator vaginally at bedtime. Use for 7 days. 45 g 0     Review of Systems  Constitutional: Negative for chills, diaphoresis, fatigue and fever.  Respiratory: Negative for shortness of breath.   Cardiovascular: Negative for chest pain.  Gastrointestinal: Negative for abdominal pain, constipation, diarrhea, nausea and vomiting.  Genitourinary: Positive for flank pain, frequency, pelvic pain and vaginal discharge. Negative for difficulty urinating, dysuria, urgency and vaginal bleeding.  Neurological: Negative for dizziness, weakness, light-headedness and headaches.  Physical Exam   Blood pressure 129/80, pulse (!) 106, temperature 98.5 F (36.9 C), temperature source Oral, resp. rate 18, height 5\' 8"  (1.727 m), weight 80.3 kg, last menstrual period 09/21/2018, SpO2 100 %.  Patient Vitals for the past 24 hrs:  BP Temp Temp src Pulse Resp SpO2 Height Weight  06/19/19 0853 - - - - - 100 % 5\' 8"  (1.727 m) 80.3 kg  06/19/19 0852 129/80 98.5 F (36.9 C) Oral (!) 106 18 - - -  06/19/19 0851 - - - - - 100 % - -  06/19/19 0848 129/80 - - 100 - - - -   Physical Exam  Constitutional: She is oriented to person, place, and time. She appears well-developed and well-nourished. No distress.  HENT:  Head: Normocephalic and atraumatic.   Respiratory: Effort normal.  GI: Soft. She exhibits no distension and no mass. There is no abdominal tenderness. There is CVA tenderness (left side). There is no rebound and no guarding.    Genitourinary: There is no rash, tenderness or lesion on the right labia. There is no rash, tenderness or lesion on the left labia. Uterus is enlarged. Uterus is not tender. Cervix exhibits no motion tenderness, no discharge and no friability.    Vaginal discharge (thick, yellow clumped) present.     No vaginal tenderness or bleeding.  No tenderness or bleeding in the vagina.    Genitourinary Comments: On SSE: no pooling of fluid, no leaking of fluid from os. CE: FT/long/posterior   Neurological: She is alert and oriented to person, place, and time.  Skin: Skin is warm and dry. She is not diaphoretic.  Psychiatric: She has a normal mood and affect. Her behavior is normal. Judgment and thought content normal.   Results for orders placed or performed during the hospital encounter of 06/19/19 (from the past 24 hour(s))  Urinalysis, Routine w reflex microscopic     Status: Abnormal   Collection Time: 06/19/19  9:11 AM  Result Value Ref Range   Color, Urine STRAW (A) YELLOW   APPearance CLEAR CLEAR   Specific Gravity, Urine 1.003 (L) 1.005 - 1.030   pH 7.0 5.0 - 8.0   Glucose, UA NEGATIVE NEGATIVE mg/dL   Hgb urine dipstick NEGATIVE NEGATIVE   Bilirubin Urine NEGATIVE NEGATIVE   Ketones, ur NEGATIVE NEGATIVE mg/dL   Protein, ur NEGATIVE NEGATIVE mg/dL   Nitrite NEGATIVE NEGATIVE   Leukocytes,Ua NEGATIVE NEGATIVE  Wet prep, genital     Status: Abnormal   Collection Time: 06/19/19  9:40 AM  Result Value Ref Range   Yeast Wet Prep HPF POC PRESENT (A) NONE SEEN   Trich, Wet Prep NONE SEEN NONE SEEN   Clue Cells Wet Prep HPF POC NONE SEEN NONE SEEN   WBC, Wet Prep HPF POC MANY (A) NONE SEEN   Sperm NONE SEEN   CBC with Differential/Platelet     Status: Abnormal   Collection Time: 06/19/19 10:08 AM   Result Value Ref Range   WBC 8.3 4.0 - 10.5 K/uL   RBC 4.13 3.87 - 5.11 MIL/uL   Hemoglobin 11.8 (L) 12.0 - 15.0 g/dL   HCT 16.137.0 09.636.0 - 04.546.0 %   MCV 89.6 80.0 - 100.0 fL   MCH 28.6 26.0 - 34.0 pg   MCHC 31.9 30.0 - 36.0 g/dL   RDW 40.914.1 81.111.5 - 91.415.5 %   Platelets 361 150 - 400 K/uL   nRBC 0.0 0.0 - 0.2 %   Neutrophils Relative % 72 %  Neutro Abs 5.9 1.7 - 7.7 K/uL   Lymphocytes Relative 23 %   Lymphs Abs 1.9 0.7 - 4.0 K/uL   Monocytes Relative 4 %   Monocytes Absolute 0.3 0.1 - 1.0 K/uL   Eosinophils Relative 1 %   Eosinophils Absolute 0.1 0.0 - 0.5 K/uL   Basophils Relative 0 %   Basophils Absolute 0.0 0.0 - 0.1 K/uL   Immature Granulocytes 0 %   Abs Immature Granulocytes 0.03 0.00 - 0.07 K/uL  [facility]     Status: None   Collection Time: 06/19/19 11:01 AM  Result Value Ref Range   Amnisure ROM NEGATIVE    [place] Renal  Result Date: 06/19/2019 CLINICAL DATA:  Costovertebral angle tenderness on left.  Pregnant. EXAM: RENAL / URINARY TRACT ULTRASOUND COMPLETE COMPARISON:  None. FINDINGS: Right Kidney: Renal measurements: 12.6 x 4.2 x 3.9 cm = volume: 107 mL . Echogenicity within normal limits. No mass or hydronephrosis visualized. Left Kidney: Renal measurements: 12.6 x 5.5 x 4.0 cm = volume: 145 mL. Echogenicity within normal limits. No mass or hydronephrosis visualized. Bladder: Appears normal for degree of bladder distention. Bilateral ureteral jets identified. IMPRESSION: Normal renal ultrasound. Electronically Signed   By: Marlan Palauharles  Clark M.D.   On: 06/19/2019 11:04    MAU Course  Procedures  MDM -r/o UTI/pyelo/stone -+CVA tenderness, left side -UA: straw/1.003, otherwise WNL, sending urine for culture based on pt symptoms -CBC w/ Diff: WNL (WBCs 8.3) -Renal US: WNL -after Flexeril, pt reports pain now 4/10  -left groin pain, suspect round ligament pain -tenderness on palpation of left inguinal area -CE: FT/long/posterior -Flexeril  given, pt reports pain now 4/10 -RX pregnancy belt  -r/o ROM -on SSE: no pooling, no leaking of fluid from os -Fern: negative -AmniSure: negative -WetPrep: +yeast, many WBCs -GC/CT collected  -EFM: reactive       -baseline: 140/135       -variability: moderate       -accels: present, 15x15       -decels: absent       -TOCO: irritability  -pt discharged to home in stable condition  Orders Placed This Encounter  Procedures  . Culture, OB Urine    Standing Status:   Standing    Number of Occurrences:   1  . Wet prep, genital    Standing Status:   Standing    Number of Occurrences:   1  . US RENAL    Standing Status:   Standing    Number of Occurrences:   1    Order Specific Question:   Symptom/Reason for Exam    Answer:   Costovertebral angle tenderness [[id number]]  . Urinalysis, Routine w reflex microscopic    Standing Status:   Standing    Number of Occurrences:   1  . CBC with Differential/Platelet    Standing Status:   Standing    Number of Occurrences:   1  . Am[facility] rupture [facility] (rom[facility]not [facility]    Standing Status:   Standing    Number of Occurrences:   1  . POCT fern test    Standing Status:   Standing    Number of Occurrences:   1  . Discharge patient    Order Specific Question:   Discharge disposition    Answer:   01-Home or Self Care [1]    Order Specific Question:   Discharge patient date    Answer:   06/19/2019   Meds  ordered this encounter  Medications  . cyclobenzaprine (FLEXERIL) tablet 10 mg  . terconazole (TERAZOL 7) 0.4 % vaginal cream    Sig: Place 1 applicator vaginally at bedtime for 7 days.    Dispense:  45 g    Refill:  0    Order Specific Question:   Supervising Provider    Answer:   Verita Schneiders A [[id number]]  . Elastic Bandages & Supports (COMFORT FIT MATERNITY SUPP SM) MISC    Sig: 1 Units by Does not apply route daily as needed.    Dispense:  1 each    Refill:  0    Order Specific Question:   Supervising Provider    Answer:    Verita Schneiders A [[id number]]  . cyclobenzaprine (FLEXERIL) 10 MG tablet    Sig: Take 1 tablet (10 mg total) by mouth 3 (three) times daily as needed for muscle spasms.    Dispense:  30 tablet    Refill:  0    Order Specific Question:   Supervising Provider    Answer:   Verita Schneiders A [[id number]]   Assessment and Plan   1. Left groin pain   2. Costovertebral angle tenderness   3. Left flank pain   4. Round ligament pain   5. Vaginal discharge during pregnancy in third trimester   6. Yeast infection of the vagina   7. Encounter for suspected premature rupture of amniotic membranes, with rupture of membranes not found    Allergies as of 06/19/2019      Reactions   Other    Unknown med      Medication List    TAKE these medications   Comfort Fit Maternity Supp Sm Misc 1 Units by Does not apply route daily as needed.   cyclobenzaprine 10 MG tablet Commonly known as: FLEXERIL Take 1 tablet (10 mg total) by mouth 3 (three) times daily as needed for muscle spasms.   famotidine 20 MG tablet Commonly known as: PEPCID Take 0.5 tablets (10 mg total) by mouth 2 (two) times daily.   Iron 28 MG Tabs Take by mouth daily.   multivitamin tablet Take by mouth.   terconazole 0.4 % vaginal cream Commonly known as: TERAZOL 7 Place 1 applicator vaginally at bedtime for 7 days. What changed: additional instructions      -will call with culture results, if positive -discussed pharmacologic and non-pharmacologic treatments of round ligament pain -RX for pregnancy belt given -RX for flexeril given -RX for terconazole -discussed s/sx of ROM/labor -bleeding/pain/PTL/return MAU precautions discussed -pt discharged to home in stable condition  Elmyra Ricks E Arryn Terrones 06/19/2019, 12:18 PM

## 2019-06-19 NOTE — Progress Notes (Signed)
FHR lost occasionally due to mother moving and cardio falling off. Marilynne Drivers, RN

## 2019-06-19 NOTE — Discharge Instructions (Signed)
PREGNANCY SUPPORT BELT: You are not alone, Seventy-five percent of women have some sort of abdominal or back pain at some point in their pregnancy. Your baby is growing at a fast pace, which means that your whole body is rapidly trying to adjust to the changes. As your uterus grows, your back may start feeling a bit under stress and this can result in back or abdominal pain that can go from mild, and therefore bearable, to severe pains that will not allow you to sit or lay down comfortably, When it comes to dealing with pregnancy-related pains and cramps, some pregnant women usually prefer natural remedies, which the market is filled with nowadays. For example, wearing a pregnancy support belt can help ease and lessen your discomfort and pain. WHAT ARE THE BENEFITS OF WEARING A PREGNANCY SUPPORT BELT? A pregnancy support belt provides support to the lower portion of the belly taking some of the weight of the growing uterus and distributing to the other parts of your body. It is designed make you comfortable and gives you extra support. Over the years, the pregnancy apparel market has been studying the needs and wants of pregnant women and they have come up with the most comfortable pregnancy support belts that woman could ever ask for. In fact, you will no longer have to wear a stretched-out or bulky pregnancy belt that is visible underneath your clothes and makes you feel even more uncomfortable. Nowadays, a pregnancy support belt is made of comfortable and stretchy materials that will not irritate your skin but will actually make you feel at ease and you will not even notice you are wearing it. They are easy to put on and adjust during the day and can be worn at night for additional support.  BENEFITS:  Relives Back pain  Relieves Abdominal Muscle and Leg Pain  Stabilizes the Pelvic Ring  Offers a Cushioned Abdominal Lift Pad  Relieves pressure on the Sciatic Nerve Within Minutes WHERE TO GET  YOUR PREGNANCY BELT: [employer] [phone] @[phone]  [address] [place], [place] [id number]   Vaginal Yeast infection, Adult  Vaginal yeast infection is a condition that causes vaginal discharge as well as soreness, swelling, and redness (inflammation) of the vagina. This is a common condition. Some women get this infection frequently. What are the causes? This condition is caused by a change in the normal balance of the yeast (candida) and bacteria that live in the vagina. This change causes an overgrowth of yeast, which causes the inflammation. What increases the risk? The condition is more likely to develop in women who:  Take antibiotic medicines.  Have diabetes.  Take birth control pills.  Are pregnant.  Douche often.  Have a weak body defense system (immune system).  Have been taking steroid medicines for a long time.  Frequently wear tight clothing. What are the signs or symptoms? Symptoms of this condition include:  White, thick, creamy vaginal discharge.  Swelling, itching, redness, and irritation of the vagina. The lips of the vagina (vulva) may be affected as well.  Pain or a burning feeling while urinating.  Pain during sex. How is this diagnosed? This condition is diagnosed based on:  Your medical history.  A physical exam.  A pelvic exam. Your health care provider will examine a sample of your vaginal discharge under a microscope. Your health care provider may send this sample for testing to confirm the diagnosis. How is this treated? This condition is treated with medicine. Medicines  may be over-the-counter or prescription. You may be told to use one or more of the following:  Medicine that is taken by mouth (orally).  Medicine that is applied as a cream (topically).  Medicine that is inserted directly into the vagina (suppository). Follow these instructions at home:  Lifestyle  Do not have sex until your health care provider  approves. Tell your sex partner that you have a yeast infection. That person should go to his or her health care provider and ask if they should also be treated.  Do not wear tight clothes, such as pantyhose or tight pants.  Wear breathable cotton underwear. General instructions  Take or apply over-the-counter and prescription medicines only as told by your health care provider.  Eat more yogurt. This may help to keep your yeast infection from returning.  Do not use tampons until your health care provider approves.  Try taking a sitz bath to help with discomfort. This is a warm water bath that is taken while you are sitting down. The water should only come up to your hips and should cover your buttocks. Do this 3-4 times per day or as told by your health care provider.  Do not douche.  If you have diabetes, keep your blood sugar levels under control.  Keep all follow-up visits as told by your health care provider. This is important. Contact a health care provider if:  You have a fever.  Your symptoms go away and then return.  Your symptoms do not get better with treatment.  Your symptoms get worse.  You have new symptoms.  You develop blisters in or around your vagina.  You have blood coming from your vagina and it is not your menstrual period.  You develop pain in your abdomen. Summary  Vaginal yeast infection is a condition that causes discharge as well as soreness, swelling, and redness (inflammation) of the vagina.  This condition is treated with medicine. Medicines may be over-the-counter or prescription.  Take or apply over-the-counter and prescription medicines only as told by your health care provider.  Do not douche. Do not have sex or use tampons until your health care provider approves.  Contact a health care provider if your symptoms do not get better with treatment or your symptoms go away and then return. This information is not intended to replace advice  given to you by your health care provider. Make sure you discuss any questions you have with your health care provider. Document Released: 08/02/2005 Document Revised: 03/11/2018 Document Reviewed: 03/11/2018 Elsevier Patient Education  [place].  Preterm Labor and Birth Information  The normal length of a pregnancy is 39-41 weeks. Preterm labor is when labor starts before 37 completed weeks of pregnancy. What are the risk factors for preterm labor? Preterm labor is more likely to occur in women who:  Have certain infections during pregnancy such as a bladder infection, sexually transmitted infection, or infection inside the uterus (chorioamnionitis).  Have a shorter-than-normal cervix.  Have gone into preterm labor before.  Have had surgery on their cervix.  Are younger than age 31 or older than age 104.  Are African American.  Are pregnant with twins or multiple babies (multiple gestation).  Take street drugs or smoke while pregnant.  Do not gain enough weight while pregnant.  Became pregnant shortly after having been pregnant. What are the symptoms of preterm labor? Symptoms of preterm labor include:  Cramps similar to those that can happen during a menstrual period. The  cramps may happen with diarrhea.  Pain in the abdomen or lower back.  Regular uterine contractions that may feel like tightening of the abdomen.  A feeling of increased pressure in the pelvis.  Increased watery or bloody mucus discharge from the vagina.  Water breaking (ruptured amniotic sac). Why is it important to recognize signs of preterm labor? It is important to recognize signs of preterm labor because babies who are born prematurely may not be fully developed. This can put them at an increased risk for:  Long-term (chronic) heart and lung problems.  Difficulty immediately after birth with regulating body systems, including blood sugar, body temperature, heart rate, and breathing  rate.  Bleeding in the brain.  Cerebral palsy.  Learning difficulties.  Death. These risks are highest for babies who are born before 34 weeks of pregnancy. How is preterm labor treated? Treatment depends on the length of your pregnancy, your condition, and the health of your baby. It may involve:  Having a stitch (suture) placed in your cervix to prevent your cervix from opening too early (cerclage).  Taking or being given medicines, such as: ? Hormone medicines. These may be given early in pregnancy to help support the pregnancy. ? Medicine to stop contractions. ? Medicines to help mature the babys lungs. These may be prescribed if the risk of delivery is high. ? Medicines to prevent your baby from developing cerebral palsy. If the labor happens before 34 weeks of pregnancy, you may need to stay in the hospital. What should I do if I think I am in preterm labor? If you think that you are going into preterm labor, call your health care provider right away. How can I prevent preterm labor in future pregnancies? To increase your chance of having a full-term pregnancy:  Do not use any tobacco products, such as cigarettes, chewing tobacco, and e-cigarettes. If you need help quitting, ask your health care provider.  Do not use street drugs or medicines that have not been prescribed to you during your pregnancy.  Talk with your health care provider before taking any herbal supplements, even if you have been taking them regularly.  Make sure you gain a healthy amount of weight during your pregnancy.  Watch for infection. If you think that you might have an infection, get it checked right away.  Make sure to tell your health care provider if you have gone into preterm labor before. This information is not intended to replace advice given to you by your health care provider. Make sure you discuss any questions you have with your health care provider. Document Released: 01/13/2004  Document Revised: 02/14/2019 Document Reviewed: 03/15/2016 Elsevier Patient Education  2020 Elsevier Inc.  Round Ligament Pain  The round ligament is a cord of muscle and tissue that helps support the uterus. It can become a source of pain during pregnancy if it becomes stretched or twisted as the baby grows. The pain usually begins in the second trimester (13-28 weeks) of pregnancy, and it can come and go until the baby is delivered. It is not a serious problem, and it does not cause harm to the baby. Round ligament pain is usually a short, sharp, and pinching pain, but it can also be a dull, lingering, and aching pain. The pain is felt in the lower side of the abdomen or in the groin. It usually starts deep in the groin and moves up to the outside of the hip area. The pain may occur when you:  Suddenly change position, such as quickly going from a sitting to standing position.  Roll over in bed.  Cough or sneeze.  Do physical activity. Follow these instructions at home:   Watch your condition for any changes.  When the pain starts, relax. Then try any of these methods to help with the pain: ? Sitting down. ? Flexing your knees up to your abdomen. ? Lying on your side with one pillow under your abdomen and another pillow between your legs. ? Sitting in a warm bath for 15-20 minutes or until the pain goes away.  Take over-the-counter and prescription medicines only as told by your health care provider.  Move slowly when you sit down or stand up.  Avoid long walks if they cause pain.  Stop or reduce your physical activities if they cause pain.  Keep all follow-up visits as told by your health care provider. This is important. Contact a health care provider if:  Your pain does not go away with treatment.  You feel pain in your back that you did not have before.  Your medicine is not helping. Get help right away if:  You have a fever or chills.  You develop uterine  contractions.  You have vaginal bleeding.  You have nausea or vomiting.  You have diarrhea.  You have pain when you urinate. Summary  Round ligament pain is felt in the lower abdomen or groin. It is usually a short, sharp, and pinching pain. It can also be a dull, lingering, and aching pain.  This pain usually begins in the second trimester (13-28 weeks). It occurs because the uterus is stretching with the growing baby, and it is not harmful to the baby.  You may notice the pain when you suddenly change position, when you cough or sneeze, or during physical activity.  Relaxing, flexing your knees to your abdomen, lying on one side, or taking a warm bath may help to get rid of the pain.  Get help from your health care provider if the pain does not go away or if you have vaginal bleeding, nausea, vomiting, diarrhea, or painful urination. This information is not intended to replace advice given to you by your health care provider. Make sure you discuss any questions you have with your health care provider. Document Released: 08/01/2008 Document Revised: 04/10/2018 Document Reviewed: 04/10/2018 Elsevier Patient Education  2020 [employer]

## 2019-06-20 LAB — GC/CHLAMYDIA PROBE AMP (~~LOC~~) NOT AT ARMC
Chlamydia: NEGATIVE
Neisseria Gonorrhea: NEGATIVE

## 2019-06-21 ENCOUNTER — Encounter: Payer: Self-pay | Admitting: Women's Health

## 2019-06-21 DIAGNOSIS — R8271 Bacteriuria: Secondary | ICD-10-CM | POA: Insufficient documentation

## 2019-06-21 LAB — CULTURE, OB URINE: Culture: 3000 — AB

## 2019-07-10 ENCOUNTER — Other Ambulatory Visit: Payer: Self-pay | Admitting: Obstetrics & Gynecology

## 2019-07-10 ENCOUNTER — Other Ambulatory Visit: Payer: Self-pay

## 2019-07-11 ENCOUNTER — Other Ambulatory Visit: Payer: Self-pay

## 2019-07-11 ENCOUNTER — Encounter (HOSPITAL_COMMUNITY): Payer: Self-pay

## 2019-07-11 ENCOUNTER — Inpatient Hospital Stay (HOSPITAL_COMMUNITY): Payer: Managed Care, Other (non HMO) | Admitting: Anesthesiology

## 2019-07-11 ENCOUNTER — Inpatient Hospital Stay (HOSPITAL_COMMUNITY)
Admission: RE | Admit: 2019-07-11 | Discharge: 2019-07-14 | DRG: 807 | Disposition: A | Discharge status: Home / Self Care | Payer: Managed Care, Other (non HMO) | Attending: Obstetrics and Gynecology | Admitting: Obstetrics and Gynecology

## 2019-07-11 ENCOUNTER — Inpatient Hospital Stay (HOSPITAL_COMMUNITY)
Admission: AD | Admit: 2019-07-11 | Payer: Managed Care, Other (non HMO) | Source: Home / Self Care | Admitting: Obstetrics & Gynecology

## 2019-07-11 DIAGNOSIS — O99824 Streptococcus B carrier state complicating childbirth: Secondary | ICD-10-CM | POA: Diagnosis present

## 2019-07-11 DIAGNOSIS — O1404 Mild to moderate pre-eclampsia, complicating childbirth: Principal | ICD-10-CM | POA: Diagnosis present

## 2019-07-11 DIAGNOSIS — Z20828 Contact with and (suspected) exposure to other viral communicable diseases: Secondary | ICD-10-CM | POA: Diagnosis present

## 2019-07-11 DIAGNOSIS — Z87891 Personal history of nicotine dependence: Secondary | ICD-10-CM | POA: Diagnosis not present

## 2019-07-11 DIAGNOSIS — O1490 Unspecified pre-eclampsia, unspecified trimester: Secondary | ICD-10-CM | POA: Diagnosis present

## 2019-07-11 DIAGNOSIS — Z3A37 37 weeks gestation of pregnancy: Secondary | ICD-10-CM | POA: Diagnosis not present

## 2019-07-11 DIAGNOSIS — R8271 Bacteriuria: Secondary | ICD-10-CM

## 2019-07-11 LAB — TYPE AND SCREEN
ABO/RH(D): A POS
Antibody Screen: NEGATIVE

## 2019-07-11 LAB — COMPREHENSIVE METABOLIC PANEL
ALT: 17 U/L (ref 0–44)
AST: 14 U/L — ABNORMAL LOW (ref 15–41)
Albumin: 2.8 g/dL — ABNORMAL LOW (ref 3.5–5.0)
Alkaline Phosphatase: 125 U/L (ref 38–126)
Anion gap: 8 (ref 5–15)
BUN: 7 mg/dL (ref 6–20)
CO2: 22 mmol/L (ref 22–32)
Calcium: 9.3 mg/dL (ref 8.9–10.3)
Chloride: 105 mmol/L (ref 98–111)
Creatinine, Ser: 0.72 mg/dL (ref 0.44–1.00)
GFR calc Af Amer: >60 mL/min (ref >60)
GFR calc non Af Amer: >60 mL/min (ref >60)
Glucose, Bld: 88 mg/dL (ref 70–99)
Potassium: 3.9 mmol/L (ref 3.5–5.1)
Sodium: 135 mmol/L (ref 135–145)
Total Bilirubin: 0.4 mg/dL (ref 0.3–1.2)
Total Protein: 6.5 g/dL (ref 6.5–8.1)

## 2019-07-11 LAB — CBC
HCT: 41.6 % (ref 36.0–46.0)
HCT: 38.6 % (ref 36.0–46.0)
Hemoglobin: 13.3 g/dL (ref 12.0–15.0)
Hemoglobin: 12.2 g/dL (ref 12.0–15.0)
MCH: 28.2 pg (ref 26.0–34.0)
MCH: 28 pg (ref 26.0–34.0)
MCHC: 32 g/dL (ref 30.0–36.0)
MCHC: 31.6 g/dL (ref 30.0–36.0)
MCV: 88.1 fL (ref 80.0–100.0)
MCV: 88.5 fL (ref 80.0–100.0)
Platelets: 300 10*3/uL (ref 150–400)
Platelets: 300 10*3/uL (ref 150–400)
RBC: 4.72 MIL/uL (ref 3.87–5.11)
RBC: 4.36 MIL/uL (ref 3.87–5.11)
RDW: 15 % (ref 11.5–15.5)
RDW: 14.8 % (ref 11.5–15.5)
WBC: 10.4 10*3/uL (ref 4.0–10.5)
WBC: 9.7 10*3/uL (ref 4.0–10.5)
nRBC: 0 % (ref 0.0–0.2)
nRBC: 0 % (ref 0.0–0.2)

## 2019-07-11 LAB — SARS CORONAVIRUS 2 BY RT PCR (HOSPITAL ORDER, PERFORMED IN ~~LOC~~ HOSPITAL LAB): SARS Coronavirus 2: NEGATIVE

## 2019-07-11 LAB — RPR: RPR Ser Ql: NONREACTIVE

## 2019-07-11 LAB — GROUP B STREP BY PCR: Group B strep by PCR: POSITIVE — AB

## 2019-07-11 LAB — PROTEIN / CREATININE RATIO, URINE
Creatinine, Urine: 67.61 mg/dL
Protein Creatinine Ratio: 0.41 mg/mg{Cre} — ABNORMAL HIGH (ref 0.00–0.15)
Total Protein, Urine: 28 mg/dL

## 2019-07-11 LAB — ABO/RH: ABO/RH(D): A POS

## 2019-07-11 LAB — URIC ACID: Uric Acid, Serum: 6.3 mg/dL (ref 2.5–7.1)

## 2019-07-11 LAB — OB RESULTS CONSOLE GBS: GBS: POSITIVE

## 2019-07-11 LAB — LACTATE DEHYDROGENASE: LDH: 187 U/L (ref 98–192)

## 2019-07-11 MED ORDER — OXYCODONE-ACETAMINOPHEN 5-325 MG PO TABS: 1.0000 | ORAL_TABLET | ORAL | Status: DC | PRN

## 2019-07-11 MED ORDER — EPHEDRINE 5 MG/ML INJ
10.0000 mg | INTRAVENOUS | Status: DC | PRN
  Filled 2019-07-11: qty 2

## 2019-07-11 MED ORDER — OXYTOCIN BOLUS FROM INFUSION
500.0000 mL | Freq: Once | INTRAVENOUS | Status: AC
  Administered 2019-07-12: 500 mL via INTRAVENOUS

## 2019-07-11 MED ORDER — LACTATED RINGERS IV SOLN
500.0000 mL | INTRAVENOUS | Status: DC | PRN
  Administered 2019-07-11: 500 mL via INTRAVENOUS

## 2019-07-11 MED ORDER — TERBUTALINE SULFATE 1 MG/ML IJ SOLN
0.2500 mg | Freq: Once | INTRAMUSCULAR | Status: DC | PRN
  Filled 2019-07-11: qty 1

## 2019-07-11 MED ORDER — TERBUTALINE SULFATE 1 MG/ML IJ SOLN
0.2500 mg | Freq: Once | INTRAMUSCULAR | Status: AC | PRN
  Administered 2019-07-11 (×2): 0.25 mg via SUBCUTANEOUS
  Filled 2019-07-11: qty 1

## 2019-07-11 MED ORDER — PHENYLEPHRINE 40 MCG/ML (10ML) SYRINGE FOR IV PUSH (FOR BLOOD PRESSURE SUPPORT)
80.0000 ug | PREFILLED_SYRINGE | INTRAVENOUS | Status: DC | PRN
  Filled 2019-07-11: qty 10

## 2019-07-11 MED ORDER — SOD CITRATE-CITRIC ACID 500-334 MG/5ML PO SOLN: 30.0000 mL | ORAL | Status: DC | PRN

## 2019-07-11 MED ORDER — LIDOCAINE HCL (PF) 1 % IJ SOLN
30.0000 mL | INTRAMUSCULAR | Status: DC | PRN
  Filled 2019-07-11: qty 30

## 2019-07-11 MED ORDER — TERBUTALINE SULFATE 1 MG/ML IJ SOLN
INTRAMUSCULAR | Status: AC
  Administered 2019-07-11: 0.25 mg via SUBCUTANEOUS
  Filled 2019-07-11: qty 1

## 2019-07-11 MED ORDER — SODIUM CHLORIDE (PF) 0.9 % IJ SOLN
INTRAMUSCULAR | Status: DC | PRN
  Administered 2019-07-11: 12 mL/h via EPIDURAL

## 2019-07-11 MED ORDER — OXYTOCIN 40 UNITS IN NORMAL SALINE INFUSION - SIMPLE MED
2.5000 [IU]/h | INTRAVENOUS | Status: DC
  Filled 2019-07-11: qty 1000

## 2019-07-11 MED ORDER — MISOPROSTOL 25 MCG QUARTER TABLET
25.0000 ug | ORAL_TABLET | ORAL | Status: DC | PRN
  Administered 2019-07-11 (×2): 25 ug via VAGINAL
  Filled 2019-07-11 (×3): qty 1

## 2019-07-11 MED ORDER — LACTATED RINGERS AMNIOINFUSION
INTRAVENOUS | Status: DC
  Administered 2019-07-11 (×2): via INTRAUTERINE
  Filled 2019-07-11 (×5): qty 1000

## 2019-07-11 MED ORDER — DIPHENHYDRAMINE HCL 50 MG/ML IJ SOLN: 12.5000 mg | INTRAMUSCULAR | Status: DC | PRN

## 2019-07-11 MED ORDER — OXYCODONE-ACETAMINOPHEN 5-325 MG PO TABS: 2.0000 | ORAL_TABLET | ORAL | Status: DC | PRN

## 2019-07-11 MED ORDER — FENTANYL CITRATE (PF) 100 MCG/2ML IJ SOLN
50.0000 ug | Freq: Once | INTRAMUSCULAR | Status: AC
  Administered 2019-07-11: 50 ug via INTRAVENOUS
  Filled 2019-07-11: qty 2

## 2019-07-11 MED ORDER — LACTATED RINGERS IV SOLN
INTRAVENOUS | Status: DC
  Administered 2019-07-11 – 2019-07-12 (×3): via INTRAVENOUS

## 2019-07-11 MED ORDER — LACTATED RINGERS IV SOLN: 500.0000 mL | Freq: Once | INTRAVENOUS | Status: DC

## 2019-07-11 MED ORDER — ONDANSETRON HCL 4 MG/2ML IJ SOLN
4.0000 mg | Freq: Four times a day (QID) | INTRAMUSCULAR | Status: DC | PRN
  Administered 2019-07-11 – 2019-07-12 (×2): 4 mg via INTRAVENOUS
  Filled 2019-07-11 (×2): qty 2

## 2019-07-11 MED ORDER — ACETAMINOPHEN 325 MG PO TABS: 650.0000 mg | ORAL_TABLET | ORAL | Status: DC | PRN

## 2019-07-11 MED ORDER — LABETALOL HCL 5 MG/ML IV SOLN
40.0000 mg | INTRAVENOUS | Status: DC | PRN
  Filled 2019-07-11: qty 8

## 2019-07-11 MED ORDER — LABETALOL HCL 5 MG/ML IV SOLN: 80.0000 mg | INTRAVENOUS | Status: DC | PRN

## 2019-07-11 MED ORDER — HYDRALAZINE HCL 20 MG/ML IJ SOLN: 10.0000 mg | INTRAMUSCULAR | Status: DC | PRN

## 2019-07-11 MED ORDER — OXYTOCIN 40 UNITS IN NORMAL SALINE INFUSION - SIMPLE MED
1.0000 m[IU]/min | INTRAVENOUS | Status: DC
  Administered 2019-07-11: 1 m[IU]/min via INTRAVENOUS

## 2019-07-11 MED ORDER — SODIUM CHLORIDE 0.9 % IV SOLN
5.0000 10*6.[IU] | Freq: Once | INTRAVENOUS | Status: AC
  Administered 2019-07-11: 5 10*6.[IU] via INTRAVENOUS
  Filled 2019-07-11: qty 5

## 2019-07-11 MED ORDER — LIDOCAINE HCL (PF) 1 % IJ SOLN
INTRAMUSCULAR | Status: DC | PRN
  Administered 2019-07-11: 10 mL via EPIDURAL

## 2019-07-11 MED ORDER — PENICILLIN G 3 MILLION UNITS IVPB - SIMPLE MED
3.0000 10*6.[IU] | INTRAVENOUS | Status: DC
  Administered 2019-07-11 (×4): 3 10*6.[IU] via INTRAVENOUS
  Filled 2019-07-11 (×7): qty 100

## 2019-07-11 MED ORDER — FENTANYL-BUPIVACAINE-NACL 0.5-0.125-0.9 MG/250ML-% EP SOLN
12.0000 mL/h | EPIDURAL | Status: DC | PRN
  Filled 2019-07-11: qty 250

## 2019-07-11 MED ORDER — LABETALOL HCL 5 MG/ML IV SOLN
20.0000 mg | INTRAVENOUS | Status: DC | PRN
  Administered 2019-07-11 (×2): 20 mg via INTRAVENOUS
  Filled 2019-07-11 (×2): qty 4

## 2019-07-11 NOTE — Anesthesia Procedure Notes (Signed)
Epidural Patient location during procedure: OB Start time: 07/11/2019 2:27 PM End time: 07/11/2019 2:39 PM  Staffing Anesthesiologist: Lidia Collum, MD Performed: anesthesiologist   Preanesthetic Checklist Completed: patient identified, pre-op evaluation, timeout performed, IV checked, risks and benefits discussed and monitors and equipment checked  Epidural Patient position: sitting Prep: DuraPrep Patient monitoring: heart rate, continuous pulse ox and blood pressure Approach: midline Location: L3-L4 Injection technique: LOR air  Needle:  Needle type: Tuohy  Needle gauge: 17 G Needle length: 9 cm Needle insertion depth: 6.5 cm Catheter type: closed end flexible Catheter size: 19 Gauge Catheter at skin depth: 11.5 cm Test dose: negative  Assessment Events: blood not aspirated, injection not painful, no injection resistance, negative IV test and no paresthesia  Additional Notes Reason for block:procedure for pain

## 2019-07-11 NOTE — Progress Notes (Signed)
Tracey Harris is a 23 y.o. G3P0020 at 69w0dadmitted for induction of labor due to pre-eclampsia without severe features.  Subjective:  Denies headache, visual symptoms or epigastric pain. Received Cytotec #2 at 6:15 this am Comfortable with  Labor support without medications Contractions every 1-2 minutes, lasting 45 seconds, intensity 5-7/10  Called by RN at 9:26 for repetitive late decelerations despite change of position. Contractions are every minute. RN instructed to give patient Terbutaline and LR bolus.  Tracings reviewed remotely. Update at 10:17 by phone: baseline rate rose to 180 bpm following Terbutaline and contractions spaced out. Variability is excellent but decelerations persist although non-repetitive. RN informed I was on my way for AROM and internal monitoring.  AROM at 11:10  with clear fluid and IUPC inserted. Initial MVU at 350 with contractions every minute and barely return to baseline. Terbutaline #2 given. Patient moved to left exaggerated SIMS. Amnioinfusion started.    Objective: BP (!) 140/96   Pulse (!) 110   Temp 99.2 F (37.3 C) (Oral)   Resp 20   Ht 5\' 8"  (1.727 m)   Wt 83.7 kg   LMP 09/21/2018   SpO2 100%   BMI 28.05 kg/m  No intake/output data recorded. No intake/output data recorded.  FHT: baseline 150-160, moderate variability, acceleration present, decelerations appear to have resolved. SVE:   Dilation: 1.5 Effacement (%): 50 Station: -2 Exam by:: Dr Cletis Media  Labs: Lab Results  Component Value Date   WBC 9.7 07/11/2019   HGB 12.2 07/11/2019   HCT 38.6 07/11/2019   MCV 88.5 07/11/2019   PLT 300 07/11/2019    Assessment / Plan: Induction of labor due to preeclampsia Fetal Wellbeing: now reassuring after resuscitative measures  Patient and FOB updated on findings. Reviewed the possible need for cesarean birth if fetal intolerance to labor.  Cesarean section reviewed with pt with R&B including but not limited to:  bleeding,  infection, injury to other organs. Low transverse approach planned which will allow vaginal delivery with future pregnancies. Should a vertical incision or inverted T be needed, patient is aware that repeat cesarean sections would be recommended in the future. Expected hospital stay and recovery also discussed.  Patient and FOB agreeable with plan. Questions answered.   Katharine Look A Tag Wurtz 07/11/2019, 11:31 AM

## 2019-07-11 NOTE — Anesthesia Preprocedure Evaluation (Signed)
Anesthesia Evaluation  Patient identified by MRN, date of birth, ID band Patient awake    Reviewed: Allergy & Precautions, H&P , NPO status , Patient's Chart, lab work & pertinent test results  History of Anesthesia Complications Negative for: history of anesthetic complications  Airway Mallampati: II  TM Distance: >3 FB Neck ROM: full    Dental no notable dental hx.    Pulmonary neg pulmonary ROS, former smoker,    Pulmonary exam normal        Cardiovascular hypertension, Normal cardiovascular exam Rhythm:regular Rate:Normal     Neuro/Psych negative neurological ROS  negative psych ROS   GI/Hepatic negative GI ROS, Neg liver ROS,   Endo/Other  negative endocrine ROS  Renal/GU negative Renal ROS  negative genitourinary   Musculoskeletal   Abdominal   Peds  Hematology negative hematology ROS (+)   Anesthesia Other Findings Pre-eclampsia; plts 300  Reproductive/Obstetrics (+) Pregnancy                             Anesthesia Physical Anesthesia Plan  ASA: III  Anesthesia Plan: Epidural   Post-op Pain Management:    Induction:   PONV Risk Score and Plan:   Airway Management Planned:   Additional Equipment:   Intra-op Plan:   Post-operative Plan:   Informed Consent: I have reviewed the patients History and Physical, chart, labs and discussed the procedure including the risks, benefits and alternatives for the proposed anesthesia with the patient or authorized representative who has indicated his/her understanding and acceptance.       Plan Discussed with:   Anesthesia Plan Comments:         Anesthesia Quick Evaluation

## 2019-07-11 NOTE — MAU Note (Signed)
Covid swab obtained without difficulty and pt tol well. No symptoms 

## 2019-07-11 NOTE — Progress Notes (Signed)
Labor Progress Note  Tracey Harris is a 23 y.o. female, G3P0020, IUP at 26 weeks, presenting for IOL for preeclampsia without severe features. H/O chronic HA disorders. Pt currently denies HA, RUQ pain, no vision changes. Pt endorse + Fm. Denies vaginal leakage. Denies vaginal bleeding. Denies feeling cxt's. Baby Female. GBS+, Covid -.   Subjective: Pt resting in bed and denies complaints, feeling very mild little cramps in lower uterus, denies HA, RUQ pain or visual changes.   Discussed foley bulb placement, with pt, reviewed R/B/A, pt consented to foley bulb.  Patient Active Problem List   Diagnosis Date Noted  . Preeclampsia 07/11/2019  . GBS bacteriuria 06/21/2019  . Palpitations 11/19/2017  . Cigarette smoker 11/19/2017  . Chronic headache 06/11/2017  . Allergic rhinitis 06/11/2017  . Insomnia 06/11/2017   Objective: BP (!) 134/105   Pulse 66   Temp 98.4 F (36.9 C) (Oral)   Resp 18   Ht 5\' 8"  (1.727 m)   Wt 83.7 kg   LMP 09/21/2018   BMI 28.05 kg/m  No intake/output data recorded. No intake/output data recorded. NST: FHR baseline 135 bpm, Variability: moderate, Accelerations:present, Decelerations:  Absent= Cat 1/Reactive CTX:  irregular, every 4-6 minutes, lasting 20-50 seconds Uterus gravid, soft non tender, moderate to palpate with contractions.  SVE:  Dilation: 1.5 Effacement (%): Thick Station: -3 Exam by:: J Kyrollos Cordell CNM    Failed foley bulb attempt.  2+DTR Patellar No clonus Lungs CTA  Assessment:  Tracey Harris is a 23 y.o. female, G3P0020, IUP at 15 weeks, presenting for IOL for preeclampsia without severe features. H/O chronic HA disorders. Pt currently denies HA, RUQ pain, no vision changes. BP now 134/105. PCR 0.4, all other preeclampsia labs WNL. Pt endorse + Fm. Denies vaginal leakage. Denies vaginal bleeding. Endorses feeling very mild cxt's. Baby Female. GBS+, Covid -. RN report elevated BP but did not qualify for magnesium, BP appear to  be labile.  Patient Active Problem List   Diagnosis Date Noted  . Preeclampsia 07/11/2019  . GBS bacteriuria 06/21/2019  . Palpitations 11/19/2017  . Cigarette smoker 11/19/2017  . Chronic headache 06/11/2017  . Allergic rhinitis 06/11/2017  . Insomnia 06/11/2017   NICHD: Category 1  Membranes:  Intact, no s/s of infection  Induction:    Cytotec x0215 & 0629  Foley Bulb: failed attempted @ 0630 on 9/4  Pain management:               IV pain management: x0             Epidural placement:  PRN  GBS Positive  Abx: x1 @ 0650   Plan: Continue labor plan Continuous/intermittent monitoring Rest Ambulate GBS+: Continue Q4H pen Frequent position changes to facilitate fetal rotation and descent. Will reassess with cervical exam in  4 hours or earlier if necessary Preeclampsia: Labetalol protocol, if SR will star magnesium. Continue cytotex per protocol Anticipate pitocin once cervic is ripened.  Anticipate labor progression and vaginal delivery.   Md Rivard aware of plan and verbalized agreement, she assumed care @ 0700.   Noralyn Pick, NP-C, CNM, MSN 07/11/2019. 6:34 AM
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
## 2019-07-11 NOTE — Progress Notes (Signed)
Subjective: Pt very numb from epidural, denies headache.  Having some back pain.  Objective: BP 131/82   Pulse 72   Temp 98.5 F (36.9 C) (Oral)   Resp 20   Ht 5\' 8"  (1.727 m)   Wt 83.7 kg   LMP 09/21/2018   SpO2 99%   BMI 28.05 kg/m  I/O last 3 completed shifts: In: -  Out: 450 [Urine:450] No intake/output data recorded.  FHT: Category 2 FHT 125 variability present occ variables with contractions to 100s UC:   irregular, every 2-5 minutes coupling of contractions SVE:   Dilation: 3 Effacement (%): 90 Station: -2 Exam by:: [email or web address], CNM   Assessment:  Pt is a G3P0020 at 37.0 IUP with pre eclampsia with out severe features Cat 2 strip GBS positive  Plan: Continue to monitor strip Position changes with peanut ball Monitor progress Will restart amnioinfusion if continues with variables Pitocin augmentation for adequate contractions  Starla Link CNM, MSN 07/11/2019, 9:27 PM

## 2019-07-11 NOTE — Progress Notes (Signed)
Spoke with Dr Alwyn Pea and requested orders. MD said they will contact the midwife to place orders.

## 2019-07-12 LAB — CBC
HCT: 36.3 % (ref 36.0–46.0)
HCT: 35.2 % — ABNORMAL LOW (ref 36.0–46.0)
Hemoglobin: 11.7 g/dL — ABNORMAL LOW (ref 12.0–15.0)
Hemoglobin: 11.4 g/dL — ABNORMAL LOW (ref 12.0–15.0)
MCH: 28.7 pg (ref 26.0–34.0)
MCH: 28.5 pg (ref 26.0–34.0)
MCHC: 32.2 g/dL (ref 30.0–36.0)
MCHC: 32.4 g/dL (ref 30.0–36.0)
MCV: 89 fL (ref 80.0–100.0)
MCV: 88 fL (ref 80.0–100.0)
Platelets: 273 10*3/uL (ref 150–400)
Platelets: 265 10*3/uL (ref 150–400)
RBC: 4.08 MIL/uL (ref 3.87–5.11)
RBC: 4 MIL/uL (ref 3.87–5.11)
RDW: 15 % (ref 11.5–15.5)
RDW: 15 % (ref 11.5–15.5)
WBC: 13.4 10*3/uL — ABNORMAL HIGH (ref 4.0–10.5)
WBC: 13.3 10*3/uL — ABNORMAL HIGH (ref 4.0–10.5)
nRBC: 0 % (ref 0.0–0.2)
nRBC: 0 % (ref 0.0–0.2)

## 2019-07-12 MED ORDER — ZOLPIDEM TARTRATE 5 MG PO TABS: 5.0000 mg | ORAL_TABLET | Freq: Every evening | ORAL | Status: DC | PRN

## 2019-07-12 MED ORDER — TETANUS-DIPHTH-ACELL PERTUSSIS 5-2.5-18.5 LF-MCG/0.5 IM SUSP: 0.5000 mL | Freq: Once | INTRAMUSCULAR | Status: DC

## 2019-07-12 MED ORDER — DIBUCAINE (PERIANAL) 1 % EX OINT: 1.0000 "application " | TOPICAL_OINTMENT | CUTANEOUS | Status: DC | PRN

## 2019-07-12 MED ORDER — COCONUT OIL OIL: 1.0000 "application " | TOPICAL_OIL | Status: DC | PRN

## 2019-07-12 MED ORDER — IBUPROFEN 600 MG PO TABS
600.0000 mg | ORAL_TABLET | Freq: Four times a day (QID) | ORAL | Status: DC
  Administered 2019-07-12 – 2019-07-14 (×9): 600 mg via ORAL
  Filled 2019-07-12 (×9): qty 1

## 2019-07-12 MED ORDER — ACETAMINOPHEN 325 MG PO TABS
650.0000 mg | ORAL_TABLET | ORAL | Status: DC | PRN
  Administered 2019-07-12 – 2019-07-13 (×3): 650 mg via ORAL
  Filled 2019-07-12 (×3): qty 2

## 2019-07-12 MED ORDER — PRENATAL MULTIVITAMIN CH
1.0000 | ORAL_TABLET | Freq: Every day | ORAL | Status: DC
  Administered 2019-07-12 – 2019-07-13 (×2): 1 via ORAL
  Filled 2019-07-12 (×2): qty 1

## 2019-07-12 MED ORDER — SIMETHICONE 80 MG PO CHEW: 80.0000 mg | CHEWABLE_TABLET | ORAL | Status: DC | PRN

## 2019-07-12 MED ORDER — ONDANSETRON HCL 4 MG/2ML IJ SOLN: 4.0000 mg | INTRAMUSCULAR | Status: DC | PRN

## 2019-07-12 MED ORDER — DIPHENHYDRAMINE HCL 25 MG PO CAPS: 25.0000 mg | ORAL_CAPSULE | Freq: Four times a day (QID) | ORAL | Status: DC | PRN

## 2019-07-12 MED ORDER — SENNOSIDES-DOCUSATE SODIUM 8.6-50 MG PO TABS
2.0000 | ORAL_TABLET | ORAL | Status: DC
  Administered 2019-07-12 – 2019-07-14 (×2): 2 via ORAL
  Filled 2019-07-12 (×2): qty 2

## 2019-07-12 MED ORDER — WITCH HAZEL-GLYCERIN EX PADS: 1.0000 "application " | MEDICATED_PAD | CUTANEOUS | Status: DC | PRN

## 2019-07-12 MED ORDER — BENZOCAINE-MENTHOL 20-0.5 % EX AERO
1.0000 "application " | INHALATION_SPRAY | CUTANEOUS | Status: DC | PRN
  Administered 2019-07-12: 1 via TOPICAL
  Filled 2019-07-12: qty 56

## 2019-07-12 MED ORDER — ONDANSETRON HCL 4 MG PO TABS: 4.0000 mg | ORAL_TABLET | ORAL | Status: DC | PRN

## 2019-07-12 MED ORDER — NIFEDIPINE ER OSMOTIC RELEASE 30 MG PO TB24
30.0000 mg | ORAL_TABLET | Freq: Every day | ORAL | Status: DC
  Administered 2019-07-13 (×2): 30 mg via ORAL
  Filled 2019-07-12 (×2): qty 1

## 2019-07-12 NOTE — Anesthesia Postprocedure Evaluation (Signed)
Anesthesia Post Note  Patient: Tracey Harris  Procedure(s) Performed: AN AD [place]     Patient location during evaluation: [facility] Anesthesia Type: Epidural Level of consciousness: awake and alert, oriented and patient cooperative Pain management: pain level controlled Vital Signs Assessment: post-procedure vital signs reviewed and stable Respiratory status: spontaneous breathing Cardiovascular status: stable Postop Assessment: no headache, epidural receding, patient able to bend at knees and no signs of nausea or vomiting Anesthetic complications: no Comments: Pain score 3.     Last Vitals:  Vitals:   07/12/19 1730 07/12/19 1815  BP: (!) 142/100 (!) 138/95  Pulse: 75 80  Resp: 18 18  Temp: 36.7 C 36.8 C  SpO2: 100% 99%    Last Pain:  Vitals:   07/12/19 1815  TempSrc: Oral  PainSc:    Pain Goal:                   [facility]

## 2019-07-12 NOTE — Progress Notes (Signed)
Subjective: Pt complaining of back pain.  7 on 0-10 scale. Objective: BP (!) 152/94   Pulse 80   Temp 98.5 F (36.9 C) (Oral)   Resp 18   Ht 5\' 8"  (1.727 m)   Wt 83.7 kg   LMP 09/21/2018   SpO2 100%   BMI 28.05 kg/m  I/O last 3 completed shifts: In: -  Out: 450 [Urine:450] No intake/output data recorded.  FHT: Category 2  FHT 120 accels noted, occ decel noted Good variability UC:   regular, every 2-3 minutes SVE:   Dilation: 7 Effacement (%): 100 Station: 0 Exam by:: [email or web address], CNM Pitocin at off due to decel and hyperstimulation MVUs 190  Assessment:  Pt is a G3P0020 at 37.0 IUP with pre eclampsia with out severe features Cat 2 strip GBS positive  Plan: Monitor progress Anticipate SVD  Starla Link CNM, MSN 07/12/2019, 12:22 AM

## 2019-07-12 NOTE — Lactation Note (Signed)
This note was copied from a baby's chart. Lactation Consultation Note  Patient Name: Girl Vici Novick [id number] Date: 07/12/2019   Attempted to visit with mom again but baby is now being transferred to the NICU, she's having seizures and is not longer stable. Mom is being transferred to room 109, mom and baby no longer in couplet care. LC to come back later tonight to do [facility] assessment.  Maternal Data    Feeding    LATCH Score                   Interventions    Lactation Tools Discussed/Used     Consult Status      Clair Alfieri Francene Boyers 07/12/2019, 5:41 PM

## 2019-07-12 NOTE — Lactation Note (Signed)
This note was copied from a baby's chart. Lactation Consultation Note  Patient Name: Tracey Harris [id number] Date: 07/12/2019 Reason for consult: Initial assessment;1st time breastfeeding;Early term 37-38.6wks;NICU baby;Infant < 6lbs P1, 24 hour female infant, ETI and currently in NICU due to Cranial [place] and EEG low oxygen levels. Mom is active on the [facility] program in [place]. Mom was transferred to [facility] speciality care unit on the first floor. Per mom, she used the DEBP once before leaving NICU.  LC reviewed how to use, clean and store breast milk with mom and mom understands EBM needs to be taken to NICU within 4 hours. Mom taught back hand expression and easily expressed 5 mo of colostrum that was put in a bullet. Mom was given bullets and labels and Dad with take EBM to NICU with labels. Mom knows to use DEBP every 3 hours for 15 minutes on initial setting. Mom know to call Nurse or LC if she has any questions or concerns regarding breastfeeding.    Maternal Data Formula Feeding for Exclusion: Yes Reason for exclusion: Mother's choice to formula and breast feed on admission Has patient been taught Hand Expression?: Yes(Mo hand expressed 5 ml of colostrum that was labelled and will be sent to NICU.)  Feeding    LATCH Score                   Interventions Interventions: DEBP;Hand express;Breast feeding basics reviewed  Lactation Tools Discussed/Used WIC Program: Yes Pump Review: Setup, frequency, and cleaning;Milk Storage Initiated by:: Vicente Serene, IBCLC Date initiated:: 07/12/19   Consult Status Consult Status: Follow-up Date: 07/13/19 Follow-up type: In-patient    Vicente Serene 07/12/2019, 8:24 PM

## 2019-07-12 NOTE — Lactation Note (Signed)
This note was copied from a baby's chart. Lactation Consultation Note  Patient Name: Girl Clydette Privitera Today's Date: 07/12/2019   Attempted to visit with mom but NICU crew was in the room working with baby, per [facility] RN baby was turning blue and had to call code. LC to come back later to do initial assessment.  Maternal Data    Feeding    LATCH Score                   Interventions    Lactation Tools Discussed/Used     Consult Status      Nasra Counce Francene Boyers 07/12/2019, 3:16 PM

## 2019-07-13 LAB — CBC WITH DIFFERENTIAL/PLATELET
Abs Immature Granulocytes: 0.05 10*3/uL (ref 0.00–0.07)
Basophils Absolute: 0 10*3/uL (ref 0.0–0.1)
Basophils Relative: 0 %
Eosinophils Absolute: 0.1 10*3/uL (ref 0.0–0.5)
Eosinophils Relative: 1 %
HCT: 35.7 % — ABNORMAL LOW (ref 36.0–46.0)
Hemoglobin: 11.3 g/dL — ABNORMAL LOW (ref 12.0–15.0)
Immature Granulocytes: 1 %
Lymphocytes Relative: 27 %
Lymphs Abs: 2.9 10*3/uL (ref 0.7–4.0)
MCH: 27.8 pg (ref 26.0–34.0)
MCHC: 31.7 g/dL (ref 30.0–36.0)
MCV: 87.9 fL (ref 80.0–100.0)
Monocytes Absolute: 0.5 10*3/uL (ref 0.1–1.0)
Monocytes Relative: 5 %
Neutro Abs: 6.9 10*3/uL (ref 1.7–7.7)
Neutrophils Relative %: 66 %
Platelets: 284 10*3/uL (ref 150–400)
RBC: 4.06 MIL/uL (ref 3.87–5.11)
RDW: 15.4 % (ref 11.5–15.5)
WBC: 10.5 10*3/uL (ref 4.0–10.5)
nRBC: 0 % (ref 0.0–0.2)

## 2019-07-13 LAB — COMPREHENSIVE METABOLIC PANEL
ALT: 16 U/L (ref 0–44)
AST: 16 U/L (ref 15–41)
Albumin: 2.5 g/dL — ABNORMAL LOW (ref 3.5–5.0)
Alkaline Phosphatase: 107 U/L (ref 38–126)
Anion gap: 9 (ref 5–15)
BUN: 6 mg/dL (ref 6–20)
CO2: 23 mmol/L (ref 22–32)
Calcium: 8.9 mg/dL (ref 8.9–10.3)
Chloride: 105 mmol/L (ref 98–111)
Creatinine, Ser: 0.74 mg/dL (ref 0.44–1.00)
GFR calc Af Amer: >60 mL/min (ref >60)
GFR calc non Af Amer: >60 mL/min (ref >60)
Glucose, Bld: 55 mg/dL — ABNORMAL LOW (ref 70–99)
Potassium: 4.2 mmol/L (ref 3.5–5.1)
Sodium: 137 mmol/L (ref 135–145)
Total Bilirubin: 0.3 mg/dL (ref 0.3–1.2)
Total Protein: 5.8 g/dL — ABNORMAL LOW (ref 6.5–8.1)

## 2019-07-13 MED ORDER — BUTALBITAL-APAP-CAFFEINE 50-325-40 MG PO TABS
1.0000 | ORAL_TABLET | Freq: Four times a day (QID) | ORAL | Status: DC | PRN
  Administered 2019-07-13: 1 via ORAL
  Filled 2019-07-13: qty 1

## 2019-07-13 NOTE — Progress Notes (Addendum)
CSW received consult for MOB due to her Flavia Shipper results of 9 and answering yes to question ten regarding thoughts of self harm. CSW and MOB discussed the EPDS that she completed and MOB reports that she is going through a lot of different emotions at this time and that she answered yes because "it had passed through her mind" once she had the baby and she went to the NICU. MOB denies any history of suicidal ideations and denies any mental health concerns. MOB states she will not actually harm herself, just that she is emotional. MOB reports that she is confident in the care the baby is receiving in NICU and is optimistic that she will be perfectly fine. MOB reports she named the baby Ava. MOB reports that FOB is involved and supportive and at this time he is completing the birth certificate. MOB states she has a great support system of family and friends. MOB states she has a car seat for safe transportation of the infant with knowledge of installation and use. MOB reports she has a crib and bassinet at home for safe sleeping. CSW and MOB discussed SIDS precautions and safe sleep methods. MOB denied any concerns or needs at this time. CSW explained that due to infant's admission to NICU, there will be follow up throughout her stay from [facility] staff. MOB expressed gratitude for CSW's call.  Madilyn Fireman, MSW, LCSW-A Clinical Social Worker [facility] [phone]

## 2019-07-13 NOTE — Lactation Note (Signed)
This note was copied from a baby's chart. Lactation Consultation Note  Patient Name: Tracey Harris [id number] Date: 07/13/2019    Visited with mom of a 64 hours old ETI NICU female, per mom she only pumped once today and she didn't like the way it felt on her breasts. She told LC she's not going to do it again and she's not going to be BF or pumping, but she did give it a try, praised her for her efforts. Baby status still declining, provided empathy to mom, reviewed engorgement prevention and treatment and let her know if she changes her mind, she can contact lactation. [place] services no longer needed at this point.  Maternal Data    Feeding    LATCH Score                   Interventions    Lactation Tools Discussed/Used     Consult Status      Aeryn Medici S Shalene Gallen 07/13/2019, 1:14 PM

## 2019-07-13 NOTE — Progress Notes (Signed)
Pt was lying in bed alert and awake when I arrived. She said she had a headache and was waiting for the nurse to bring her something. I asked if she would rather have a visit at another time and she said yes. She spoke of her baby who is in ICU and hoped to go visit her soon. I will refer pt to day chaplain unless she needs a visit prior to tomorrow. Please page if needed. [place], [place]   07/13/19 1400  Clinical Encounter Type  Visited With Patient and family together

## 2019-07-13 NOTE — Progress Notes (Signed)
Subjective: Postpartum Day # 2 : S/P NSVD due to IOL for preeclampsia without severe features. Pt never received magnesium has been asymptomatic with neg labs, elevated BP 140/90s yesterday and started on procardia, pt tolerates well, denies HA, RUQ pain, no vision changes. Patient up ad lib, denies syncope or dizziness. Reports consuming regular diet without issues and denies N/V. Patient reports 0 bowel movement + passing flatus.  Denies issues with urination and reports bleeding is "lighter."  Patient is pumping, but baby female in NICU for seizure activity.  Desires undecided for postpartum contraception.  Pain is being appropriately managed with use of po meds.   1st degree laceration Feeding:  pump Contraceptive plan:  undecided Baby female in NICU  Objective: Vital signs in last 24 hours: Patient Vitals for the past 24 hrs:  BP Temp Temp src Pulse Resp SpO2  07/13/19 0759 132/88 98 F (36.7 C) Oral 75 18 99 %  07/13/19 0314 127/79 98.2 F (36.8 C) Oral 76 18 100 %  07/13/19 0100 137/83 - - 95 - -  07/12/19 2030 (!) 140/92 98.4 F (36.9 C) Oral 73 18 100 %  07/12/19 1815 (!) 138/95 98.2 F (36.8 C) Oral 80 18 99 %  07/12/19 1730 (!) 142/100 98.1 F (36.7 C) Oral 75 18 100 %  07/12/19 1325 137/90 97.7 F (36.5 C) Axillary 67 16 100 %     Physical Exam:  General: alert, cooperative, appears stated age and no distress Mood/Affect: Happy Lungs: clear to auscultation, no wheezes, rales or rhonchi, symmetric air entry.  Heart: normal rate, regular rhythm, normal S1, S2, no murmurs, rubs, clicks or gallops. Breast: breasts appear normal, no suspicious masses, no skin or nipple changes or axillary nodes. Abdomen:  + bowel sounds, soft, non-tender GU: perineum approximate, healing well. No signs of external hematomas.  Uterine Fundus: firm Lochia: appropriate Skin: Warm, Dry. DVT Evaluation: No evidence of DVT seen on physical exam. Negative Homan's sign. No cords or calf  tenderness. No significant calf/ankle edema. 2+Patellar DTR No clonus  CBC Latest Ref Rng & Units 07/13/2019 07/12/2019 07/12/2019  WBC 4.0 - 10.5 K/uL 10.5 13.3(H) 13.4(H)  Hemoglobin 12.0 - 15.0 g/dL 11.3(L) 11.4(L) 11.7(L)  Hematocrit 36.0 - 46.0 % 35.7(L) 35.2(L) 36.3  Platelets 150 - 400 K/uL 284 265 273    Results for orders placed or performed during the hospital encounter of 07/11/19 (from the past 24 hour(s))  CBC with Differential/Platelet     Status: Abnormal   Collection Time: 07/13/19  5:47 AM  Result Value Ref Range   WBC 10.5 4.0 - 10.5 K/uL   RBC 4.06 3.87 - 5.11 MIL/uL   Hemoglobin 11.3 (L) 12.0 - 15.0 g/dL   HCT 74.235.7 (L) 59.536.0 - 63.846.0 %   MCV 87.9 80.0 - 100.0 fL   MCH 27.8 26.0 - 34.0 pg   MCHC 31.7 30.0 - 36.0 g/dL   RDW 75.615.4 43.311.5 - 29.515.5 %   Platelets 284 150 - 400 K/uL   nRBC 0.0 0.0 - 0.2 %   Neutrophils Relative % 66 %   Neutro Abs 6.9 1.7 - 7.7 K/uL   Lymphocytes Relative 27 %   Lymphs Abs 2.9 0.7 - 4.0 K/uL   Monocytes Relative 5 %   Monocytes Absolute 0.5 0.1 - 1.0 K/uL   Eosinophils Relative 1 %   Eosinophils Absolute 0.1 0.0 - 0.5 K/uL   Basophils Relative 0 %   Basophils Absolute 0.0 0.0 - 0.1 K/uL   Immature  Granulocytes 1 %   Abs Immature Granulocytes 0.05 0.00 - 0.07 K/uL  Comprehensive metabolic panel     Status: Abnormal   Collection Time: 07/13/19  5:47 AM  Result Value Ref Range   Sodium 137 135 - 145 mmol/L   Potassium 4.2 3.5 - 5.1 mmol/L   Chloride 105 98 - 111 mmol/L   CO2 23 22 - 32 mmol/L   Glucose, Bld 55 (L) 70 - 99 mg/dL   BUN 6 6 - 20 mg/dL   Creatinine, Ser 0.74 0.44 - 1.00 mg/dL   Calcium 8.9 8.9 - 10.3 mg/dL   Total Protein 5.8 (L) 6.5 - 8.1 g/dL   Albumin 2.5 (L) 3.5 - 5.0 g/dL   AST 16 15 - 41 U/L   ALT 16 0 - 44 U/L   Alkaline Phosphatase 107 38 - 126 U/L   Total Bilirubin 0.3 0.3 - 1.2 mg/dL   GFR calc non Af Amer >60 >60 mL/min   GFR calc Af Amer >60 >60 mL/min   Anion gap 9 5 - 15     CBG (last 3)  No results  for input(s): GLUCAP in the last 72 hours.   I/O last 3 completed shifts: In: 0  Out: 1715 [Urine:1350; Blood:365]   Assessment Postpartum Day # 2 : S/P NSVD due to IOL for preeclampsia without severe features, asymptomatic, labs unremarkable, elevated BP 140/90s yesterday started on procardia, morning BP 132/88 cbc and cmp WNL. Pt stable. -2 involution. Pump feeding. Baby female in [place] for seizures. Hemodynamically stable.   Plan: Continue other mgmt as ordered PreE: Continue procardia 30mg  xl daily, will increase if BP >140/90s.  VTE prophylactics: Early ambulated as tolerates.  Pain control: Motrin/Tylenol PRN Education given regarding options for contraception, including barrier methods, injectable contraception, IUD placement, oral contraceptives.  Plan for discharge tomorrow, Breastfeeding and Lactation consult   Dr. Nelda Marseille to be updated on patient Melissa NP-C, CNM 07/13/2019, 10:44 AM

## 2019-07-14 MED ORDER — NIFEDIPINE ER 30 MG PO TB24: 30.0000 mg | ORAL_TABLET | Freq: Every day | ORAL | 0 refills | Status: DC

## 2019-07-14 MED ORDER — BUTALBITAL-APAP-CAFFEINE 50-325-40 MG PO TABS: 1.0000 | ORAL_TABLET | Freq: Four times a day (QID) | ORAL | 0 refills | Status: DC | PRN

## 2019-07-14 NOTE — Discharge Summary (Signed)
SVD OB Discharge Summary     Patient Name: Tracey Harris DOB: 1996/05/12 MRN: [id number]  Date of admission: 07/11/2019 Delivering MD: Kenney Houseman  Date of delivery: 07/12/2019 Type of delivery: SVD  Newborn Data: Sex: Baby female Currently in NICU for suspected seizure activity, baby to stay in NICU Live born female  Birth Weight: 4 lb 9.4 oz (2080 g) APGAR: 5, 6  Newborn Delivery   Birth date/time: 07/12/2019 02:57:00 Delivery type: Vaginal, Spontaneous      Feeding: bottle Infant being discharge to home with mother in stable condition.   Admitting diagnosis: INDUCTION Intrauterine pregnancy: [redacted]w[redacted]d     Secondary diagnosis:  Active Problems:   Preeclampsia   SVD (spontaneous vaginal delivery)   Normal postpartum course                                Complications: None                                                              Intrapartum Procedures: spontaneous vaginal delivery and GBS prophylaxis Postpartum Procedures: none Complications-Operative and Postpartum: 1st degree perineal laceration Augmentation: AROM, Pitocin and Cytotec   History of Present Illness: [name] is a 23 y.o. female, G3P0020, who presents at [redacted]w[redacted]d weeks gestation. The patient has been followed at  [facility]  Her pregnancy has been complicated by:  Patient Active Problem List   Diagnosis Date Noted  . Normal postpartum course 07/14/2019  . SVD (spontaneous vaginal delivery) 07/12/2019  . Preeclampsia 07/11/2019  . GBS bacteriuria 06/21/2019  . Palpitations 11/19/2017  . Cigarette smoker 11/19/2017  . Chronic headache 06/11/2017  . Allergic rhinitis 06/11/2017  . Insomnia 06/11/2017    Hospital course:  Induction of Labor With Vaginal Delivery   23 y.o. yo G3P0020 at [redacted]w[redacted]d was admitted to the hospital 07/11/2019 for induction of labor.  Indication for induction: Preeclampsia.  Patient had an uncomplicated labor course as  follows: Membrane Rupture Time/Date: 11:10 AM ,07/11/2019   Intrapartum Procedures: Episiotomy: None [1]                                         Lacerations:  1st degree [2];Labial [10]  Patient had delivery of a Viable infant.  Information for the patient's newborn:  Tracey, Harris [[id number]]  Delivery Method: Vaginal, Spontaneous(Filed from Delivery Summary)    07/12/2019  Details of delivery can be found in separate delivery note.  Patient had a routine postpartum course. Patient is discharged home 07/14/19. Postpartum Day # 3 : S/P NSVD due to IOL for preeclampsia without severe features, Pt had PCR of 0.4, all other labs wnl, no HA, with elevated BP, one day PP elveated BP placed on 30 xl procardia with BP now in 130/80s, and denies HA, RUQ pain no vision changes. Pt stable, pt never required magnesium. Baby will stay in NICVU. Patient up ad lib, denies syncope or dizziness. Reports consuming regular diet without issues and denies N/V. Patient reports 0 bowel movement + passing flatus.  Denies issues with urination and reports bleeding is "  lighter."  Patient is bottle feeding and reports going well.  Desires nexplanon for postpartum contraception.  Pain is being appropriately managed with use of po meds, no motrin due to elevated BP, but may take small script of Fioricet for HA, is no resolution pt to call [facility] .   Physical exam  Vitals:   07/14/19 0013 07/14/19 0014 07/14/19 0419 07/14/19 0759  BP: 138/90 138/90 134/79 133/80  Pulse: 78 78 84 91  Resp: 18  18 18   Temp: 98.3 F (36.8 C)  97.9 F (36.6 C) 98.1 F (36.7 C)  TempSrc: Oral  Oral Oral  SpO2: 97%  97% 99%  Weight:      Height:       General: alert, cooperative and no distress Lochia: appropriate Uterine Fundus: firm Perineum: approximate, no hemtomas DVT Evaluation: No evidence of DVT seen on physical exam. Negative Homan's sign. No cords or calf tenderness. No significant calf/ankle edema.  Labs: Lab Results   Component Value Date   WBC 10.5 07/13/2019   HGB 11.3 (L) 07/13/2019   HCT 35.7 (L) 07/13/2019   MCV 87.9 07/13/2019   PLT 284 07/13/2019   CMP Latest Ref Rng & Units 07/13/2019  Glucose 70 - 99 mg/dL 52(W55(L)  BUN 6 - 20 mg/dL 6  Creatinine 4.130.44 - 2.441.00 mg/dL 0.100.74  Sodium 272135 - 536145 mmol/L 137  Potassium 3.5 - 5.1 mmol/L 4.2  Chloride 98 - 111 mmol/L 105  CO2 22 - 32 mmol/L 23  Calcium 8.9 - 10.3 mg/dL 8.9  Total Protein 6.5 - 8.1 g/dL 6.4(Q5.8(L)  Total Bilirubin 0.3 - 1.2 mg/dL 0.3  Alkaline Phos 38 - 126 U/L 107  AST 15 - 41 U/L 16  ALT 0 - 44 U/L 16    Date of discharge: 07/14/2019 Discharge Diagnoses: Term Pregnancy-delivered and Preelampsia Discharge instruction: per After Visit Summary and "Baby and Me Booklet".  After visit meds:  Allergies as of 07/14/2019   No Active Allergies     Medication List    TAKE these medications   butalbital-acetaminophen-caffeine 50-325-40 MG tablet Commonly known as: FIORICET Take 1 tablet by mouth every 6 (six) hours as needed for headache.   NIFEdipine 30 MG 24 hr tablet Commonly known as: ADALAT CC Take 1 tablet (30 mg total) by mouth daily.       Activity:           unrestricted and pelvic rest Advance as tolerated. Pelvic rest for 6 weeks.  Diet:                routine Medications: PNV and fiorocet, and tylenol not to exceed 4000mg /day of tylenol Postpartum contraception: Nexplanon Condition:  Pt discharge to home without baby in stable condition  PreE: Educated on S/SX to report, and within one week follow up at [facility]  Meds: Allergies as of 07/14/2019   No Active Allergies     Medication List    TAKE these medications   butalbital-acetaminophen-caffeine 50-325-40 MG tablet Commonly known as: FIORICET Take 1 tablet by mouth every 6 (six) hours as needed for headache.   NIFEdipine 30 MG 24 hr tablet Commonly known as: ADALAT CC Take 1 tablet (30 mg total) by mouth daily.       Discharge Follow Up:  Follow-up  Information    [facility]   Specialty: Obstetrics and Gynecology Why: 1 weeks PP BP check and 6 weeks PPV Contact information: [address]  [place] [address] [place], NP-C, CNM 07/14/2019, 9:16 AM  Noralyn Pick, [place]

## 2019-07-14 NOTE — Progress Notes (Signed)
Patient discharged to home in stable condition. VSS; pain well controlled with oral medication; no pain at this time.  Discharge instructions including medication, reasons to call doctor, and reasons to return to hospital, ect reviewed with patient and significant other.  All questions answered and voiced understanding.    -Bonnielee Haff RN

## 2019-12-12 ENCOUNTER — Encounter (HOSPITAL_BASED_OUTPATIENT_CLINIC_OR_DEPARTMENT_OTHER): Payer: Self-pay | Admitting: Emergency Medicine

## 2019-12-12 ENCOUNTER — Other Ambulatory Visit: Payer: Self-pay

## 2019-12-12 ENCOUNTER — Emergency Department (HOSPITAL_BASED_OUTPATIENT_CLINIC_OR_DEPARTMENT_OTHER)
Admission: EM | Admit: 2019-12-12 | Discharge: 2019-12-12 | Disposition: A | Discharge status: Home / Self Care | Payer: Managed Care, Other (non HMO) | Attending: Emergency Medicine | Admitting: Emergency Medicine

## 2019-12-12 DIAGNOSIS — Z20822 Contact with and (suspected) exposure to covid-19: Secondary | ICD-10-CM | POA: Insufficient documentation

## 2019-12-12 DIAGNOSIS — Z87891 Personal history of nicotine dependence: Secondary | ICD-10-CM | POA: Insufficient documentation

## 2019-12-12 DIAGNOSIS — J029 Acute pharyngitis, unspecified: Secondary | ICD-10-CM | POA: Diagnosis not present

## 2019-12-12 LAB — GROUP A STREP BY PCR: Group A Strep by PCR: NOT DETECTED

## 2019-12-12 NOTE — ED Notes (Signed)
ED Provider at bedside. 
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
## 2019-12-12 NOTE — Discharge Instructions (Signed)
Take tylenol 2 pills 4 times a day and motrin 4 pills 3 times a day.  Drink plenty of fluids.  Return for worsening shortness of breath, headache, confusion. Follow up with your family doctor.   

## 2019-12-12 NOTE — ED Triage Notes (Signed)
  Patient comes in with sore throat that started yesterday and has gotten progressively worse over the last 24 hrs.  Patient also endorses bilateral ear drainage and nasal congestion.  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  Patient has been taking Mucinex DM with the last dose at 0400 this morning.  No fever at home. No body aches or chest pain.  Patient states throat is scratchy and feels like something is stuck.  Pain 7/10

## 2019-12-13 LAB — NOVEL CORONAVIRUS, NAA (HOSP ORDER, SEND-OUT TO REF LAB; TAT 18-24 HRS): SARS-CoV-2, NAA: NOT DETECTED

## 2020-04-04 ENCOUNTER — Other Ambulatory Visit: Payer: Self-pay

## 2020-04-04 ENCOUNTER — Emergency Department (HOSPITAL_BASED_OUTPATIENT_CLINIC_OR_DEPARTMENT_OTHER)
Admission: EM | Admit: 2020-04-04 | Discharge: 2020-04-04 | Disposition: A | Discharge status: Home / Self Care | Payer: BLUE CROSS/BLUE SHIELD | Attending: Emergency Medicine | Admitting: Emergency Medicine

## 2020-04-04 ENCOUNTER — Encounter (HOSPITAL_BASED_OUTPATIENT_CLINIC_OR_DEPARTMENT_OTHER): Payer: Self-pay | Admitting: Emergency Medicine

## 2020-04-04 DIAGNOSIS — Z87891 Personal history of nicotine dependence: Secondary | ICD-10-CM | POA: Insufficient documentation

## 2020-04-04 DIAGNOSIS — H60392 Other infective otitis externa, left ear: Secondary | ICD-10-CM

## 2020-04-04 DIAGNOSIS — J069 Acute upper respiratory infection, unspecified: Secondary | ICD-10-CM | POA: Diagnosis not present

## 2020-04-04 DIAGNOSIS — H9202 Otalgia, left ear: Secondary | ICD-10-CM | POA: Diagnosis present

## 2020-04-04 DIAGNOSIS — Z20822 Contact with and (suspected) exposure to covid-19: Secondary | ICD-10-CM | POA: Diagnosis not present

## 2020-04-04 LAB — SARS CORONAVIRUS 2 (TAT 6-24 HRS): SARS Coronavirus 2: NEGATIVE

## 2020-04-04 MED ORDER — HYDROCORTISONE-ACETIC ACID 1-2 % OT SOLN: 3.0000 [drp] | Freq: Three times a day (TID) | OTIC | 0 refills | Status: AC

## 2020-04-04 NOTE — Discharge Instructions (Signed)
You will be called if your Covid test is positive in the next 24 hours. Please continue to wear a mask and isolate until then. Use eardrops and follow-up with your doctor for improvement and recheck in 4 to 5 days.
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
## 2020-04-04 NOTE — ED Triage Notes (Signed)
Pt here with cold-like sx x 3 days.

## 2020-06-29 ENCOUNTER — Other Ambulatory Visit: Payer: Self-pay

## 2020-06-29 ENCOUNTER — Emergency Department (HOSPITAL_BASED_OUTPATIENT_CLINIC_OR_DEPARTMENT_OTHER)
Admission: EM | Admit: 2020-06-29 | Discharge: 2020-06-29 | Disposition: A | Discharge status: Home / Self Care | Payer: Medicaid Other | Attending: Emergency Medicine | Admitting: Emergency Medicine

## 2020-06-29 ENCOUNTER — Encounter (HOSPITAL_BASED_OUTPATIENT_CLINIC_OR_DEPARTMENT_OTHER): Payer: Self-pay | Admitting: *Deleted

## 2020-06-29 ENCOUNTER — Emergency Department (HOSPITAL_BASED_OUTPATIENT_CLINIC_OR_DEPARTMENT_OTHER): Payer: Medicaid Other

## 2020-06-29 DIAGNOSIS — Z20822 Contact with and (suspected) exposure to covid-19: Secondary | ICD-10-CM | POA: Diagnosis not present

## 2020-06-29 DIAGNOSIS — Z87891 Personal history of nicotine dependence: Secondary | ICD-10-CM | POA: Insufficient documentation

## 2020-06-29 DIAGNOSIS — J069 Acute upper respiratory infection, unspecified: Secondary | ICD-10-CM

## 2020-06-29 DIAGNOSIS — J029 Acute pharyngitis, unspecified: Secondary | ICD-10-CM | POA: Diagnosis present

## 2020-06-29 LAB — SARS CORONAVIRUS 2 BY RT PCR (HOSPITAL ORDER, PERFORMED IN ~~LOC~~ HOSPITAL LAB): SARS Coronavirus 2: NEGATIVE

## 2020-06-29 MED ORDER — ACETAMINOPHEN 325 MG PO TABS
650.0000 mg | ORAL_TABLET | Freq: Once | ORAL | Status: AC
  Administered 2020-06-29: 650 mg via ORAL
  Filled 2020-06-29: qty 2

## 2020-06-29 NOTE — ED Triage Notes (Signed)
C/o sore throat, head ache, muscle pain, cough Family member in house had covid x 2-3 days
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
## 2020-06-29 NOTE — Discharge Instructions (Addendum)
If you develop high fever, severe cough or cough with blood, trouble breathing, severe headache, neck pain/stiffness, vomiting, or any other new/concerning symptoms then return to the ER for evaluation  

## 2020-07-28 ENCOUNTER — Emergency Department (HOSPITAL_COMMUNITY)
Admission: EM | Admit: 2020-07-28 | Discharge: 2020-07-28 | Disposition: A | Discharge status: Home / Self Care | Payer: Medicaid Other | Attending: Emergency Medicine | Admitting: Emergency Medicine

## 2020-07-28 ENCOUNTER — Other Ambulatory Visit: Payer: Self-pay

## 2020-07-28 ENCOUNTER — Encounter (HOSPITAL_COMMUNITY): Payer: Self-pay | Admitting: Emergency Medicine

## 2020-07-28 DIAGNOSIS — Z20822 Contact with and (suspected) exposure to covid-19: Secondary | ICD-10-CM | POA: Diagnosis not present

## 2020-07-28 DIAGNOSIS — Z87891 Personal history of nicotine dependence: Secondary | ICD-10-CM | POA: Insufficient documentation

## 2020-07-28 DIAGNOSIS — B349 Viral infection, unspecified: Secondary | ICD-10-CM | POA: Insufficient documentation

## 2020-07-28 DIAGNOSIS — R509 Fever, unspecified: Secondary | ICD-10-CM | POA: Diagnosis present

## 2020-07-28 LAB — SARS CORONAVIRUS 2 BY RT PCR (HOSPITAL ORDER, PERFORMED IN ~~LOC~~ HOSPITAL LAB): SARS Coronavirus 2: NEGATIVE

## 2020-07-28 LAB — GROUP A STREP BY PCR: Group A Strep by PCR: NOT DETECTED

## 2020-07-28 MED ORDER — ACETAMINOPHEN 500 MG PO TABS
1000.0000 mg | ORAL_TABLET | Freq: Once | ORAL | Status: AC
  Administered 2020-07-28: 1000 mg via ORAL
  Filled 2020-07-28: qty 2

## 2020-07-28 MED ORDER — BENZONATATE 100 MG PO CAPS: 100.0000 mg | ORAL_CAPSULE | Freq: Three times a day (TID) | ORAL | 0 refills | Status: DC

## 2020-07-28 MED ORDER — ONDANSETRON 4 MG PO TBDP
4.0000 mg | ORAL_TABLET | Freq: Once | ORAL | Status: AC
  Administered 2020-07-28: 4 mg via ORAL
  Filled 2020-07-28: qty 1

## 2020-07-28 MED ORDER — GUAIFENESIN 100 MG/5ML PO LIQD: 100.0000 mg | ORAL | 0 refills | Status: DC | PRN

## 2020-07-28 NOTE — ED Triage Notes (Signed)
Per pt, states she has a runny nose, muscle aches, sore throat, headache for 3 days-states she has not been around anyone with covid
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
## 2020-07-28 NOTE — ED Notes (Signed)
Pt had an episode of emesis.

## 2020-11-13 ENCOUNTER — Encounter (HOSPITAL_BASED_OUTPATIENT_CLINIC_OR_DEPARTMENT_OTHER): Payer: Self-pay | Admitting: *Deleted

## 2020-11-13 ENCOUNTER — Emergency Department (HOSPITAL_BASED_OUTPATIENT_CLINIC_OR_DEPARTMENT_OTHER)
Admission: EM | Admit: 2020-11-13 | Discharge: 2020-11-13 | Disposition: A | Discharge status: Home / Self Care | Payer: Medicaid Other | Attending: Emergency Medicine | Admitting: Emergency Medicine

## 2020-11-13 ENCOUNTER — Other Ambulatory Visit: Payer: Self-pay

## 2020-11-13 DIAGNOSIS — F17210 Nicotine dependence, cigarettes, uncomplicated: Secondary | ICD-10-CM | POA: Insufficient documentation

## 2020-11-13 DIAGNOSIS — R509 Fever, unspecified: Secondary | ICD-10-CM | POA: Diagnosis present

## 2020-11-13 DIAGNOSIS — U071 COVID-19: Secondary | ICD-10-CM | POA: Insufficient documentation

## 2020-11-13 MED ORDER — ACETAMINOPHEN 325 MG PO TABS: 650.0000 mg | ORAL_TABLET | Freq: Once | ORAL | Status: DC | PRN

## 2020-11-13 NOTE — ED Triage Notes (Addendum)
Cough, headache, red eyes, chills, fever x 1 day. She has had the moderna covid vaccine. Pt states at end of triage that she took 2 tylenol around 8 pm

## 2020-11-13 NOTE — Discharge Instructions (Signed)
Take Tylenol 1000 mg rotated with ibuprofen 600 mg every 4 hours as needed for pain or fever.  Take over-the-counter medications as needed for relief of symptoms.  Isolate at home until the results of your Covid test are known.  This should be tomorrow morning or afternoon.  Return to the emergency department if you develop severe chest pain, difficulty breathing, or other new and concerning symptoms.

## 2020-11-13 NOTE — ED Notes (Signed)
See EDP assessment 
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
## 2020-11-14 LAB — SARS CORONAVIRUS 2 (TAT 6-24 HRS): SARS Coronavirus 2: POSITIVE — AB

## 2021-03-22 ENCOUNTER — Ambulatory Visit (HOSPITAL_COMMUNITY)
Admission: EM | Admit: 2021-03-22 | Discharge: 2021-03-22 | Disposition: A | Discharge status: Home / Self Care | Payer: Medicaid Other

## 2021-03-22 ENCOUNTER — Other Ambulatory Visit: Payer: Self-pay

## 2021-03-22 ENCOUNTER — Encounter (HOSPITAL_COMMUNITY): Payer: Self-pay

## 2021-03-22 DIAGNOSIS — S0502XA Injury of conjunctiva and corneal abrasion without foreign body, left eye, initial encounter: Secondary | ICD-10-CM

## 2021-03-22 MED ORDER — FLUORESCEIN SODIUM 1 MG OP STRP
ORAL_STRIP | OPHTHALMIC | Status: AC
  Filled 2021-03-22: qty 1

## 2021-03-22 MED ORDER — ERYTHROMYCIN 5 MG/GM OP OINT: TOPICAL_OINTMENT | OPHTHALMIC | 0 refills | Status: DC

## 2021-03-22 MED ORDER — TETRACAINE HCL 0.5 % OP SOLN
OPHTHALMIC | Status: AC
  Filled 2021-03-22: qty 4
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
## 2021-03-22 NOTE — ED Triage Notes (Signed)
Pt reports swelling in the upper and lower left eyelid x 1 day; feeling a foreign body in the left eye.

## 2021-03-22 NOTE — Discharge Instructions (Signed)
Use eye ointment daily.  Please wash her hands and avoid touching tip of bottle to your eyelid to prevent contaminating medication.  Use lubricating eyedrops.  Do not wear contact lenses.  If your symptoms persist please follow-up with ophthalmology as we discussed.

## 2021-08-26 ENCOUNTER — Encounter (HOSPITAL_COMMUNITY): Payer: Self-pay

## 2021-08-26 ENCOUNTER — Emergency Department (HOSPITAL_COMMUNITY)
Admission: EM | Admit: 2021-08-26 | Discharge: 2021-08-26 | Disposition: A | Discharge status: Home / Self Care | Payer: Medicaid Other | Attending: Emergency Medicine | Admitting: Emergency Medicine

## 2021-08-26 DIAGNOSIS — Z20822 Contact with and (suspected) exposure to covid-19: Secondary | ICD-10-CM | POA: Insufficient documentation

## 2021-08-26 DIAGNOSIS — J111 Influenza due to unidentified influenza virus with other respiratory manifestations: Secondary | ICD-10-CM

## 2021-08-26 DIAGNOSIS — J09X2 Influenza due to identified novel influenza A virus with other respiratory manifestations: Secondary | ICD-10-CM | POA: Diagnosis not present

## 2021-08-26 DIAGNOSIS — F17210 Nicotine dependence, cigarettes, uncomplicated: Secondary | ICD-10-CM | POA: Insufficient documentation

## 2021-08-26 DIAGNOSIS — R52 Pain, unspecified: Secondary | ICD-10-CM | POA: Diagnosis present

## 2021-08-26 LAB — CBC WITH DIFFERENTIAL/PLATELET
Abs Immature Granulocytes: 0.04 10*3/uL (ref 0.00–0.07)
Basophils Absolute: 0 10*3/uL (ref 0.0–0.1)
Basophils Relative: 0 %
Eosinophils Absolute: 0 10*3/uL (ref 0.0–0.5)
Eosinophils Relative: 0 %
HCT: 40.8 % (ref 36.0–46.0)
Hemoglobin: 13.3 g/dL (ref 12.0–15.0)
Immature Granulocytes: 1 %
Lymphocytes Relative: 7 %
Lymphs Abs: 0.4 10*3/uL — ABNORMAL LOW (ref 0.7–4.0)
MCH: 30 pg (ref 26.0–34.0)
MCHC: 32.6 g/dL (ref 30.0–36.0)
MCV: 92.1 fL (ref 80.0–100.0)
Monocytes Absolute: 0.3 10*3/uL (ref 0.1–1.0)
Monocytes Relative: 5 %
Neutro Abs: 5.7 10*3/uL (ref 1.7–7.7)
Neutrophils Relative %: 87 %
Platelets: 278 10*3/uL (ref 150–400)
RBC: 4.43 MIL/uL (ref 3.87–5.11)
RDW: 12.6 % (ref 11.5–15.5)
WBC: 6.5 10*3/uL (ref 4.0–10.5)
nRBC: 0 % (ref 0.0–0.2)

## 2021-08-26 LAB — I-STAT BETA HCG BLOOD, ED (MC, WL, AP ONLY): I-stat hCG, quantitative: <5 m[IU]/mL (ref <5)

## 2021-08-26 LAB — COMPREHENSIVE METABOLIC PANEL
ALT: 19 U/L (ref 0–44)
AST: 16 U/L (ref 15–41)
Albumin: 3.9 g/dL (ref 3.5–5.0)
Alkaline Phosphatase: 71 U/L (ref 38–126)
Anion gap: 7 (ref 5–15)
BUN: 7 mg/dL (ref 6–20)
CO2: 21 mmol/L — ABNORMAL LOW (ref 22–32)
Calcium: 8.9 mg/dL (ref 8.9–10.3)
Chloride: 109 mmol/L (ref 98–111)
Creatinine, Ser: 0.75 mg/dL (ref 0.44–1.00)
GFR, Estimated: >60 mL/min (ref >60)
Glucose, Bld: 106 mg/dL — ABNORMAL HIGH (ref 70–99)
Potassium: 4.1 mmol/L (ref 3.5–5.1)
Sodium: 137 mmol/L (ref 135–145)
Total Bilirubin: 0.4 mg/dL (ref 0.3–1.2)
Total Protein: 7.6 g/dL (ref 6.5–8.1)

## 2021-08-26 LAB — RESP PANEL BY RT-PCR (FLU A&B, COVID) ARPGX2
Influenza A by PCR: POSITIVE — AB
Influenza B by PCR: NEGATIVE
SARS Coronavirus 2 by RT PCR: NEGATIVE

## 2021-08-26 LAB — GROUP A STREP BY PCR: Group A Strep by PCR: NOT DETECTED

## 2021-08-26 LAB — LIPASE, BLOOD: Lipase: 22 U/L (ref 11–51)

## 2021-08-26 MED ORDER — SODIUM CHLORIDE 0.9 % IV BOLUS
1000.0000 mL | Freq: Once | INTRAVENOUS | Status: AC
  Administered 2021-08-26: 1000 mL via INTRAVENOUS

## 2021-08-26 MED ORDER — ONDANSETRON HCL 4 MG/2ML IJ SOLN
4.0000 mg | Freq: Once | INTRAMUSCULAR | Status: AC
  Administered 2021-08-26: 4 mg via INTRAVENOUS
  Filled 2021-08-26: qty 2

## 2021-08-26 MED ORDER — ACETAMINOPHEN 325 MG PO TABS
650.0000 mg | ORAL_TABLET | Freq: Once | ORAL | Status: AC
  Administered 2021-08-26: 650 mg via ORAL
  Filled 2021-08-26: qty 2

## 2021-08-26 MED ORDER — OSELTAMIVIR PHOSPHATE 75 MG PO CAPS: 75.0000 mg | ORAL_CAPSULE | Freq: Two times a day (BID) | ORAL | 0 refills | Status: DC

## 2021-08-26 NOTE — Discharge Instructions (Signed)
Follow-upIt was pleasure taking care of you today.  As discussed, your flu test was positive.  I am sending home with treatment.  Take twice a day for the next 5 days.  Continue to stay hydrated.  PCP symptoms not improve over the next week.  Return to the ER for any worsening symptoms.
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
## 2021-08-26 NOTE — ED Triage Notes (Signed)
Pt arrived via POV, c/o body aches and sore throat x2 days. Daughter also sick.

## 2021-12-01 ENCOUNTER — Emergency Department (HOSPITAL_BASED_OUTPATIENT_CLINIC_OR_DEPARTMENT_OTHER)
Admission: EM | Admit: 2021-12-01 | Discharge: 2021-12-01 | Disposition: A | Discharge status: Home / Self Care | Payer: Medicaid Other | Attending: Emergency Medicine | Admitting: Emergency Medicine

## 2021-12-01 ENCOUNTER — Other Ambulatory Visit: Payer: Self-pay

## 2021-12-01 ENCOUNTER — Encounter (HOSPITAL_BASED_OUTPATIENT_CLINIC_OR_DEPARTMENT_OTHER): Payer: Self-pay

## 2021-12-01 DIAGNOSIS — R059 Cough, unspecified: Secondary | ICD-10-CM | POA: Diagnosis present

## 2021-12-01 DIAGNOSIS — U071 COVID-19: Secondary | ICD-10-CM | POA: Diagnosis not present

## 2021-12-01 LAB — RESP PANEL BY RT-PCR (FLU A&B, COVID) ARPGX2
Influenza A by PCR: NEGATIVE
Influenza B by PCR: NEGATIVE
SARS Coronavirus 2 by RT PCR: POSITIVE — AB

## 2021-12-01 MED ORDER — LIDOCAINE VISCOUS HCL 2 % MT SOLN: 15.0000 mL | Freq: Four times a day (QID) | OROMUCOSAL | 0 refills | Status: DC | PRN

## 2021-12-01 MED ORDER — ONDANSETRON 4 MG PO TBDP: 4.0000 mg | ORAL_TABLET | Freq: Three times a day (TID) | ORAL | 0 refills | Status: DC | PRN

## 2021-12-01 MED ORDER — ACETAMINOPHEN 325 MG PO TABS
650.0000 mg | ORAL_TABLET | Freq: Once | ORAL | Status: AC
  Administered 2021-12-01: 650 mg via ORAL
  Filled 2021-12-01: qty 2

## 2021-12-01 MED ORDER — ONDANSETRON 4 MG PO TBDP
4.0000 mg | ORAL_TABLET | Freq: Once | ORAL | Status: AC
  Administered 2021-12-01: 4 mg via ORAL
  Filled 2021-12-01: qty 1

## 2021-12-01 MED ORDER — LIDOCAINE VISCOUS HCL 2 % MT SOLN
15.0000 mL | Freq: Once | OROMUCOSAL | Status: AC
  Administered 2021-12-01: 15 mL via OROMUCOSAL
  Filled 2021-12-01: qty 15

## 2021-12-01 NOTE — Discharge Instructions (Signed)
You came to the emergency department today with reports of Covid-19 like symptoms.   You tested positive for COVID-19. Please isolate at home for at least 7 days after the day your symptoms initially began, and THEN at least 24 hours after you are fever-free without the help of medications (Tylenol/acetaminophen and Advil/ibuprofen/Motrin) AND your symptoms are improving. ? ?You can alternate Tylenol/acetaminophen and Advil/ibuprofen/Motrin every 4 hours for sore throat, body aches, headache or fever.  ?Drink plenty of water.  ?Use saline nasal spray for congestion. ?You can take Tessalon every 8 hours as needed for cough. ?You can take Zofran every 8 hours as needed for nausea and vomiting. ?Wash your hands frequently. ?Please rest as needed with frequent repositioning and ambulation as tolerated.   ? ?If you use a CPAP or BiPAP device for management of obstructive sleep apnea may continue to use it however use it when isolated from other individuals to avoid spread of COVID-19.   ?If you use a nebulizer administer medication such as albuterol you may continue to use it however only one isolated from other individuals to avoid the spread of COVID-19. ? ?If your symptoms do not improve please follow-up with your primary care provider or urgent care. ? ?Return to the ER for significant shortness of breath, uncontrollable vomiting, severe chest pain, inability to tolerate fluids, changes in mental status such as confusion or other concerning symptoms. ? ?

## 2021-12-01 NOTE — ED Provider Notes (Signed)
[facility] EMERGENC[facility] DEPARTMENT Provider Note   CSN: [id number] Arrival date & time: 12/01/21  2022     History  Chief Complaint  Patient presents with   Cough    Tracey Harris is a 26 y.o. female with a past medical history of migraines.  Presents to the emergency department with a chief complaint of flulike symptoms.  Patient reports that her symptoms started earlier today upon waking.  Patient endorses rhinorrhea, nasal congestion, fever, chills, generalized body aches, sore throat, headache, decreased appetite, nausea, vomiting, fatigue.  Patient reports that her headache onset was gradual pain progressively worse over time.  Pain is located to frontotemporal aspect of her head.  Patient states that pain is similar to previous migraine she has had in the past  Patient reports vomiting approximately 10-15 times in the last 24 hours.  Patient describes emesis as stomach contents and bilious.  Denies any hematemesis or coffee-ground emesis.  No associated abdominal pain.  Patient denies any dysuria, hematuria, urinary urgency, vaginal pain, vaginal bleeding, vaginal discharge, numbness, weakness, facial asymmetry, dysarthria, cough, swallowing, trismus, hot potato voice, drooling.  Patient denies any known sick contacts.  Patient has been vaccinated for COVID-19 however has not received any boosters.  Patient has not been vaccinated for influenza.  Patient reports that she has not been sexually active in over a year.   Cough Associated symptoms: chills, fever, myalgias (Generalized), rhinorrhea and sore throat   Associated symptoms: no chest pain, no headaches, no rash and no shortness of breath       Home Medications Prior to Admission medications   Not on File      Allergies    Patient has no known allergies.    Review of Systems   Review of Systems  Constitutional:  Positive for chills and fever.  HENT:  Positive for congestion, rhinorrhea and sore throat.  Negative for drooling, facial swelling and voice change.   Eyes:  Negative for visual disturbance.  Respiratory:  Negative for cough and shortness of breath.   Cardiovascular:  Negative for chest pain.  Gastrointestinal:  Positive for nausea and vomiting. Negative for abdominal distention, abdominal pain, anal bleeding, blood in stool, constipation, diarrhea and rectal pain.  Genitourinary:  Negative for difficulty urinating, dysuria, flank pain, frequency, genital sores, hematuria, pelvic pain, vaginal bleeding, vaginal discharge and vaginal pain.  Musculoskeletal:  Positive for myalgias (Generalized). Negative for back pain and neck pain.  Skin:  Negative for color change and rash.  Neurological:  Negative for dizziness, syncope, light-headedness and headaches.  Psychiatric/Behavioral:  Negative for confusion.    Physical Exam Updated Vital Signs BP 130/88 (BP Location: Left Arm)    Pulse (!) 111    Temp (!) 102.6 F (39.2 C) (Oral)    Resp 20    Ht 5\' 8"  (1.727 m)    Wt 79.4 kg    SpO2 100%    BMI 26.61 kg/m  Physical Exam Vitals and nursing note reviewed.  Constitutional:      General: She is not in acute distress.    Appearance: She is ill-appearing. She is not toxic-appearing or diaphoretic.  HENT:     Head: Normocephalic.     Jaw: No trismus or pain on movement.     Mouth/Throat:     Lips: Pink. No lesions.     Mouth: Mucous membranes are moist.     Tongue: No lesions. Tongue does not deviate from midline.     Palate: No  mass and lesions.     Pharynx: Oropharynx is clear. Uvula midline. No pharyngeal swelling, oropharyngeal exudate, posterior oropharyngeal erythema or uvula swelling.     Tonsils: No tonsillar exudate or tonsillar abscesses. 1+ on the right. 1+ on the left.     Comments: Cobblestoning to posterior pharynx.  Handles oral secretions without difficulty Eyes:     General: No scleral icterus.       Right eye: No discharge.        Left eye: No discharge.  Neck:      Comments: No swelling to submandibular space Cardiovascular:     Rate and Rhythm: Normal rate.  Pulmonary:     Effort: Pulmonary effort is normal. No tachypnea, bradypnea or respiratory distress.     Breath sounds: Normal breath sounds. No stridor.  Abdominal:     General: Abdomen is flat. Bowel sounds are normal. There is no distension. There are no signs of injury.     Palpations: Abdomen is soft. There is no shifting dullness, mass or pulsatile mass.     Tenderness: There is no abdominal tenderness. There is no right CVA tenderness, left CVA tenderness, guarding or rebound.     Hernia: There is no hernia in the umbilical area or ventral area.  Musculoskeletal:     Cervical back: Full passive range of motion without pain, normal range of motion and neck supple. No edema, erythema, signs of trauma, rigidity, torticollis or crepitus. No pain with movement, spinous process tenderness or muscular tenderness. Normal range of motion.  Lymphadenopathy:     Cervical: No cervical adenopathy.  Skin:    General: Skin is warm and dry.  Neurological:     General: No focal deficit present.     Mental Status: She is alert.     GCS: GCS eye subscore is 4. GCS verbal subscore is 5. GCS motor subscore is 6.  Psychiatric:        Behavior: Behavior is cooperative.    ED Results / Procedures / Treatments   Labs (all labs ordered are listed, but only abnormal results are displayed) Labs Reviewed  RESP PANEL BY RT-PCR (FLU A&B, COVID) ARPGX2 - Abnormal; Notable for the following components:      Result Value   SARS Coronavirus 2 by RT PCR POSITIVE (*)    All other components within normal limits    EKG None  Radiology No results found.  Procedures Procedures    Medications Ordered in ED Medications  ondansetron (ZOFRAN-ODT) disintegrating tablet 4 mg (4 mg Oral Given 12/01/21 2037)  acetaminophen (TYLENOL) tablet 650 mg (650 mg Oral Given 12/01/21 2038)    ED Course/ Medical Decision  Making/ A&P                           Medical Decision Making Risk OTC drugs. Prescription drug management.   Alert 26 year old female no acute stress, nontoxic-appearing.  Patient appears ill.  Presents with flulike illness.  Information obtained from patient.  Medical records were reviewed including previous provider notes and lab work.  Patient was ordered Zofran for nausea, Tylenol for fever and viscous lidocaine for sore throat.  Patient had improvement in symptoms after receiving this medication.  Patient able to tolerate p.o. intake without difficulty.  I ordered laboratory testing and reviewed.  Pertinent findings include positive COVID-19 result.  Suspect that patient symptoms are due to viral illness.  Will prescribe patient with Zofran and viscous lidocaine.  Discussed symptomatic treatment and isolation precautions with patient.  Patient to follow-up with PCP if symptoms do not improve.  Discussed results, findings, treatment and follow up. Patient advised of return precautions. Patient verbalized understanding and agreed with plan.  Tracey Harris was evaluated in Emergency Department on 12/01/2021 for the symptoms described in the history of present illness. She was evaluated in the context of the global COVID-19 pandemic, which necessitated consideration that the patient might be at risk for infection with the SARS-CoV-2 virus that causes COVID-19. Institutional protocols and algorithms that pertain to the evaluation of patients at risk for COVID-19 are in a state of rapid change based on information released by regulatory bodies including the CDC and federal and state organizations. These policies and algorithms were followed during the patient's care in the ED.         Final Clinical Impression(s) / ED Diagnoses Final diagnoses:  [id number]    Rx / DC Orders ED Discharge Orders          Ordered    lidocaine (XYLOCAINE) 2 % solution  Every 6 hours PRN         12/01/21 2157    ondansetron (ZOFRAN-ODT) 4 MG disintegrating tablet  Every 8 hours PRN        12/01/21 2157              Loni Beckwith, PA-C 12/01/21 2210    Drenda Freeze, MD 12/01/21 2220

## 2021-12-01 NOTE — ED Triage Notes (Signed)
Pt c/o flu like sx x today-NAD-steady gait 

## 2021-12-22 ENCOUNTER — Emergency Department (HOSPITAL_BASED_OUTPATIENT_CLINIC_OR_DEPARTMENT_OTHER): Payer: Medicaid Other

## 2021-12-22 ENCOUNTER — Other Ambulatory Visit: Payer: Self-pay

## 2021-12-22 ENCOUNTER — Emergency Department (HOSPITAL_BASED_OUTPATIENT_CLINIC_OR_DEPARTMENT_OTHER)
Admission: EM | Admit: 2021-12-22 | Discharge: 2021-12-22 | Disposition: A | Discharge status: Home / Self Care | Payer: Medicaid Other | Attending: Emergency Medicine | Admitting: Emergency Medicine

## 2021-12-22 ENCOUNTER — Encounter (HOSPITAL_BASED_OUTPATIENT_CLINIC_OR_DEPARTMENT_OTHER): Payer: Self-pay | Admitting: Urology

## 2021-12-22 DIAGNOSIS — Z20822 Contact with and (suspected) exposure to covid-19: Secondary | ICD-10-CM | POA: Insufficient documentation

## 2021-12-22 DIAGNOSIS — B974 Respiratory syncytial virus as the cause of diseases classified elsewhere: Secondary | ICD-10-CM | POA: Diagnosis not present

## 2021-12-22 DIAGNOSIS — J069 Acute upper respiratory infection, unspecified: Secondary | ICD-10-CM | POA: Insufficient documentation

## 2021-12-22 DIAGNOSIS — B338 Other specified viral diseases: Secondary | ICD-10-CM

## 2021-12-22 DIAGNOSIS — R059 Cough, unspecified: Secondary | ICD-10-CM | POA: Diagnosis present

## 2021-12-22 LAB — BASIC METABOLIC PANEL
Anion gap: 10 (ref 5–15)
BUN: 8 mg/dL (ref 6–20)
CO2: 23 mmol/L (ref 22–32)
Calcium: 9.9 mg/dL (ref 8.9–10.3)
Chloride: 102 mmol/L (ref 98–111)
Creatinine, Ser: 0.81 mg/dL (ref 0.44–1.00)
GFR, Estimated: >60 mL/min (ref >60)
Glucose, Bld: 92 mg/dL (ref 70–99)
Potassium: 3.7 mmol/L (ref 3.5–5.1)
Sodium: 135 mmol/L (ref 135–145)

## 2021-12-22 LAB — CBC WITH DIFFERENTIAL/PLATELET
Abs Immature Granulocytes: 0.02 10*3/uL (ref 0.00–0.07)
Basophils Absolute: 0 10*3/uL (ref 0.0–0.1)
Basophils Relative: 0 %
Eosinophils Absolute: 0.2 10*3/uL (ref 0.0–0.5)
Eosinophils Relative: 3 %
HCT: 41.2 % (ref 36.0–46.0)
Hemoglobin: 13.7 g/dL (ref 12.0–15.0)
Immature Granulocytes: 0 %
Lymphocytes Relative: 14 %
Lymphs Abs: 1 10*3/uL (ref 0.7–4.0)
MCH: 29.5 pg (ref 26.0–34.0)
MCHC: 33.3 g/dL (ref 30.0–36.0)
MCV: 88.6 fL (ref 80.0–100.0)
Monocytes Absolute: 0.4 10*3/uL (ref 0.1–1.0)
Monocytes Relative: 6 %
Neutro Abs: 5.4 10*3/uL (ref 1.7–7.7)
Neutrophils Relative %: 77 %
Platelets: 329 10*3/uL (ref 150–400)
RBC: 4.65 MIL/uL (ref 3.87–5.11)
RDW: 13.1 % (ref 11.5–15.5)
WBC: 6.9 10*3/uL (ref 4.0–10.5)
nRBC: 0 % (ref 0.0–0.2)

## 2021-12-22 LAB — PREGNANCY, URINE: Preg Test, Ur: NEGATIVE

## 2021-12-22 LAB — RESP PANEL BY RT-PCR (FLU A&B, COVID) ARPGX2
Influenza A by PCR: NEGATIVE
Influenza B by PCR: NEGATIVE
SARS Coronavirus 2 by RT PCR: NEGATIVE

## 2021-12-22 LAB — GROUP A STREP BY PCR: Group A Strep by PCR: NOT DETECTED

## 2021-12-22 MED ORDER — PROMETHAZINE-DM 6.25-15 MG/5ML PO SYRP: 5.0000 mL | ORAL_SOLUTION | Freq: Four times a day (QID) | ORAL | 0 refills | Status: DC | PRN

## 2021-12-22 MED ORDER — ONDANSETRON 4 MG PO TBDP: 4.0000 mg | ORAL_TABLET | Freq: Three times a day (TID) | ORAL | 0 refills | Status: DC | PRN

## 2021-12-22 MED ORDER — ONDANSETRON HCL 4 MG/2ML IJ SOLN
4.0000 mg | Freq: Once | INTRAMUSCULAR | Status: AC
  Administered 2021-12-22: 4 mg via INTRAVENOUS
  Filled 2021-12-22: qty 2

## 2021-12-22 NOTE — ED Provider Notes (Signed)
[place] EMERGENCY DEPARTMENT Provider Note   CSN: [id number] Arrival date & time: 12/22/21  1717     History  Chief Complaint  Patient presents with   Cough    Tracey Harris is a 26 y.o. female who presents to the ED today with complaint of flu like symptoms for the past 2 days. Pt complains of gradual onset, constant, sharp, sore throat. She also complains of bilateral ear pain. She reports she feels like she has "blisters" in her throat and ears. She also endorses productive cough, myalgias, chest pain with coughing, and post tussive emesis. Per chart review pt was recently diagnosed with COVID on 1/26. She reports she had been doing better at that time however a couple of days ago began feeling worse. She states she has not eaten or drank anything in 2 days time due to decreased appetite. She reports if she tries to drink too much she will vomit as well. Denies abdominal pain.    The history is provided by the patient and medical records.      Home Medications Prior to Admission medications   Medication Sig Start Date End Date Taking? Authorizing Provider  ondansetron (ZOFRAN-ODT) 4 MG disintegrating tablet Take 1 tablet (4 mg total) by mouth every 8 (eight) hours as needed for nausea or vomiting. 12/22/21  Yes Myiah Petkus, PA-C  promethazine-dextromethorphan (PROMETHAZINE-DM) 6.25-15 MG/5ML syrup Take 5 mLs by mouth 4 (four) times daily as needed for cough. 12/22/21  Yes Nyajah Hyson, PA-C  lidocaine (XYLOCAINE) 2 % solution Use as directed 15 mLs in the mouth or throat every 6 (six) hours as needed for mouth pain. 12/01/21   Loni Beckwith, PA-C  ondansetron (ZOFRAN-ODT) 4 MG disintegrating tablet Take 1 tablet (4 mg total) by mouth every 8 (eight) hours as needed for nausea or vomiting. 12/01/21   Loni Beckwith, PA-C      Allergies    Patient has no known allergies.    Review of Systems   Review of Systems  Constitutional:  Positive for  chills and fatigue.  HENT:  Positive for ear pain and sore throat.   Respiratory:  Positive for cough.   Gastrointestinal:  Positive for vomiting (post tussive). Negative for abdominal pain.  Musculoskeletal:  Positive for myalgias.  All other systems reviewed and are negative.  Physical Exam Updated Vital Signs BP 125/83 (BP Location: Right Arm)    Pulse 77    Temp 99.6 F (37.6 C) (Oral)    Resp 18    Ht 5\' 8"  (1.727 m)    Wt 79.4 kg    SpO2 100%    BMI 26.62 kg/m  Physical Exam Vitals and nursing note reviewed.  Constitutional:      Appearance: She is not ill-appearing or diaphoretic.  HENT:     Head: Normocephalic and atraumatic.     Right Ear: Tympanic membrane and ear canal normal.     Left Ear: Tympanic membrane and ear canal normal.     Mouth/Throat:     Mouth: Mucous membranes are moist.     Pharynx: Posterior oropharyngeal erythema present. No oropharyngeal exudate.  Eyes:     Conjunctiva/sclera: Conjunctivae normal.  Cardiovascular:     Rate and Rhythm: Normal rate and regular rhythm.     Pulses: Normal pulses.  Pulmonary:     Effort: Pulmonary effort is normal.     Breath sounds: Normal breath sounds. No wheezing, rhonchi or rales.  Abdominal:  Palpations: Abdomen is soft.     Tenderness: There is no abdominal tenderness.  Musculoskeletal:     Cervical back: Neck supple.  Skin:    General: Skin is warm and dry.  Neurological:     Mental Status: She is alert.    ED Results / Procedures / Treatments   Labs (all labs ordered are listed, but only abnormal results are displayed) Labs Reviewed  RESP PANEL BY RT-PCR (FLU A&B, COVID) ARPGX2  GROUP A STREP BY PCR  BASIC METABOLIC PANEL  CBC WITH DIFFERENTIAL/PLATELET  PREGNANCY, URINE    EKG None  Radiology DG Chest 2 View  Result Date: 12/22/2021 CLINICAL DATA:  cough EXAM: CHEST - 2 VIEW COMPARISON:  None FINDINGS: The heart and mediastinal contours are unchanged. No focal consolidation. No pulmonary  edema. No pleural effusion. No pneumothorax. No acute osseous abnormality. IMPRESSION: No active cardiopulmonary disease. Electronically Signed   By: Iven Finn M.D.   On: 12/22/2021 18:48    Procedures Procedures    Medications Ordered in ED Medications  ondansetron (ZOFRAN) injection 4 mg (4 mg Intravenous Given 12/22/21 1851)    ED Course/ Medical Decision Making/ A&P                           Medical Decision Making 26 year old female who presents to the ED today with flulike symptoms for the past 2 days.  Recently had COVID 3 weeks ago.  On arrival to the ED patient's afebrile, nontachycardic and nontachypneic.  She appears to be in no acute distress.  She states her main complaint is sore throat at this time.  She is tolerating secretions without difficulty and phonating normally.  Posterior oropharynx with mild erythema.  Uvula midline.  TMs clear bilaterally.  There are to auscultation bilaterally.  No evident fluid collected in waiting room which has returned negative at this time.  We will plan for strep testing with complaint of sore throat as well as lab work.  Patient states that she has been vomiting.  She does state at times it is posttussive and then at other times it is after drinking or eating too much.  Her abdomen is soft and nontender.  We will provide Zofran and reevaluate. CXR added to rule out pneumonia.   Problems Addressed: RSV (respiratory syncytial virus infection): acute illness or injury  Amount and/or Complexity of Data Reviewed Labs: ordered.    Details: CBC without leukocytosis. Hgb stable at 13.7 BMP without electrolyte abnormalities UPT negative STrep negative COVID and flu negative however lab informed pt tested positive for RSV. Radiology: ordered.    Details: CXR clear  Risk Prescription drug management.          Final Clinical Impression(s) / ED Diagnoses Final diagnoses:  RSV (respiratory syncytial virus infection)    Rx / DC  Orders ED Discharge Orders          Ordered    ondansetron (ZOFRAN-ODT) 4 MG disintegrating tablet  Every 8 hours PRN        12/22/21 2031    promethazine-dextromethorphan (PROMETHAZINE-DM) 6.25-15 MG/5ML syrup  4 times daily PRN        12/22/21 2031             Discharge Instructions      You tested positive for RSV at this time. It is a respiratory virus that will go away on its own. Please pick up cough medication and nausea medication and take  as needed.   Continue taking OTC medications for symptomatic relief. Drink plenty of fluids to stay hydrated.   Follow up with your PCP for further eval  Return to the ED for any new/worsening symptoms       Eustaquio Maize, PA-C [id number]    Campbell Stall P, DO [id number]

## 2021-12-22 NOTE — ED Triage Notes (Signed)
Ears and throat pain x 2 days  States cough, rib pain with cough  Weakness, nausea noted x 2 days

## 2021-12-22 NOTE — ED Notes (Signed)
Patient transported to X-ray 

## 2021-12-22 NOTE — Discharge Instructions (Signed)
You tested positive for RSV at this time. It is a respiratory virus that will go away on its own. Please pick up cough medication and nausea medication and take as needed.   Continue taking OTC medications for symptomatic relief. Drink plenty of fluids to stay hydrated.   Follow up with your PCP for further eval  Return to the ED for any new/worsening symptoms

## 2021-12-22 NOTE — ED Notes (Signed)
Pt NAD a/ox4, c/o bilateral earache, bodyaches, sore throat, cough and n/v x 2 days

## 2021-12-22 NOTE — ED Notes (Signed)
Pt NAD, a/ox4. Pt verbalizes understanding of all DC and f/u instructions. All questions answered. Pt walks with steady gait to lobby at DC.  ? ?

## 2022-04-23 ENCOUNTER — Ambulatory Visit (HOSPITAL_COMMUNITY)
Admission: EM | Admit: 2022-04-23 | Discharge: 2022-04-23 | Disposition: A | Discharge status: Home / Self Care | Payer: Medicaid Other | Attending: Emergency Medicine | Admitting: Emergency Medicine

## 2022-04-23 ENCOUNTER — Encounter (HOSPITAL_COMMUNITY): Payer: Self-pay

## 2022-04-23 DIAGNOSIS — H5789 Other specified disorders of eye and adnexa: Secondary | ICD-10-CM

## 2022-04-23 MED ORDER — OLOPATADINE HCL 0.1 % OP SOLN: 1.0000 [drp] | Freq: Two times a day (BID) | OPHTHALMIC | 0 refills | Status: AC

## 2022-04-23 NOTE — Discharge Instructions (Addendum)
Use the drops twice a day. Apply warm compress to the eyelid to help with swelling.  Try tylenol or ibuprofen if you have pain.  If your symptoms worsen, please go to the emergency department.

## 2022-04-23 NOTE — ED Triage Notes (Signed)
Pt presents with left eye irritation, drainage, and swelling X 2 days; pt states it feels like something is in there and scraping her eye.
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
## 2022-09-03 IMAGING — DX DG CHEST 2V
2 series · 2 of 2 positions shown · non-contrast
Comparison: None

CLINICAL DATA: cough

EXAM:
CHEST - 2 VIEW

[chest pa]
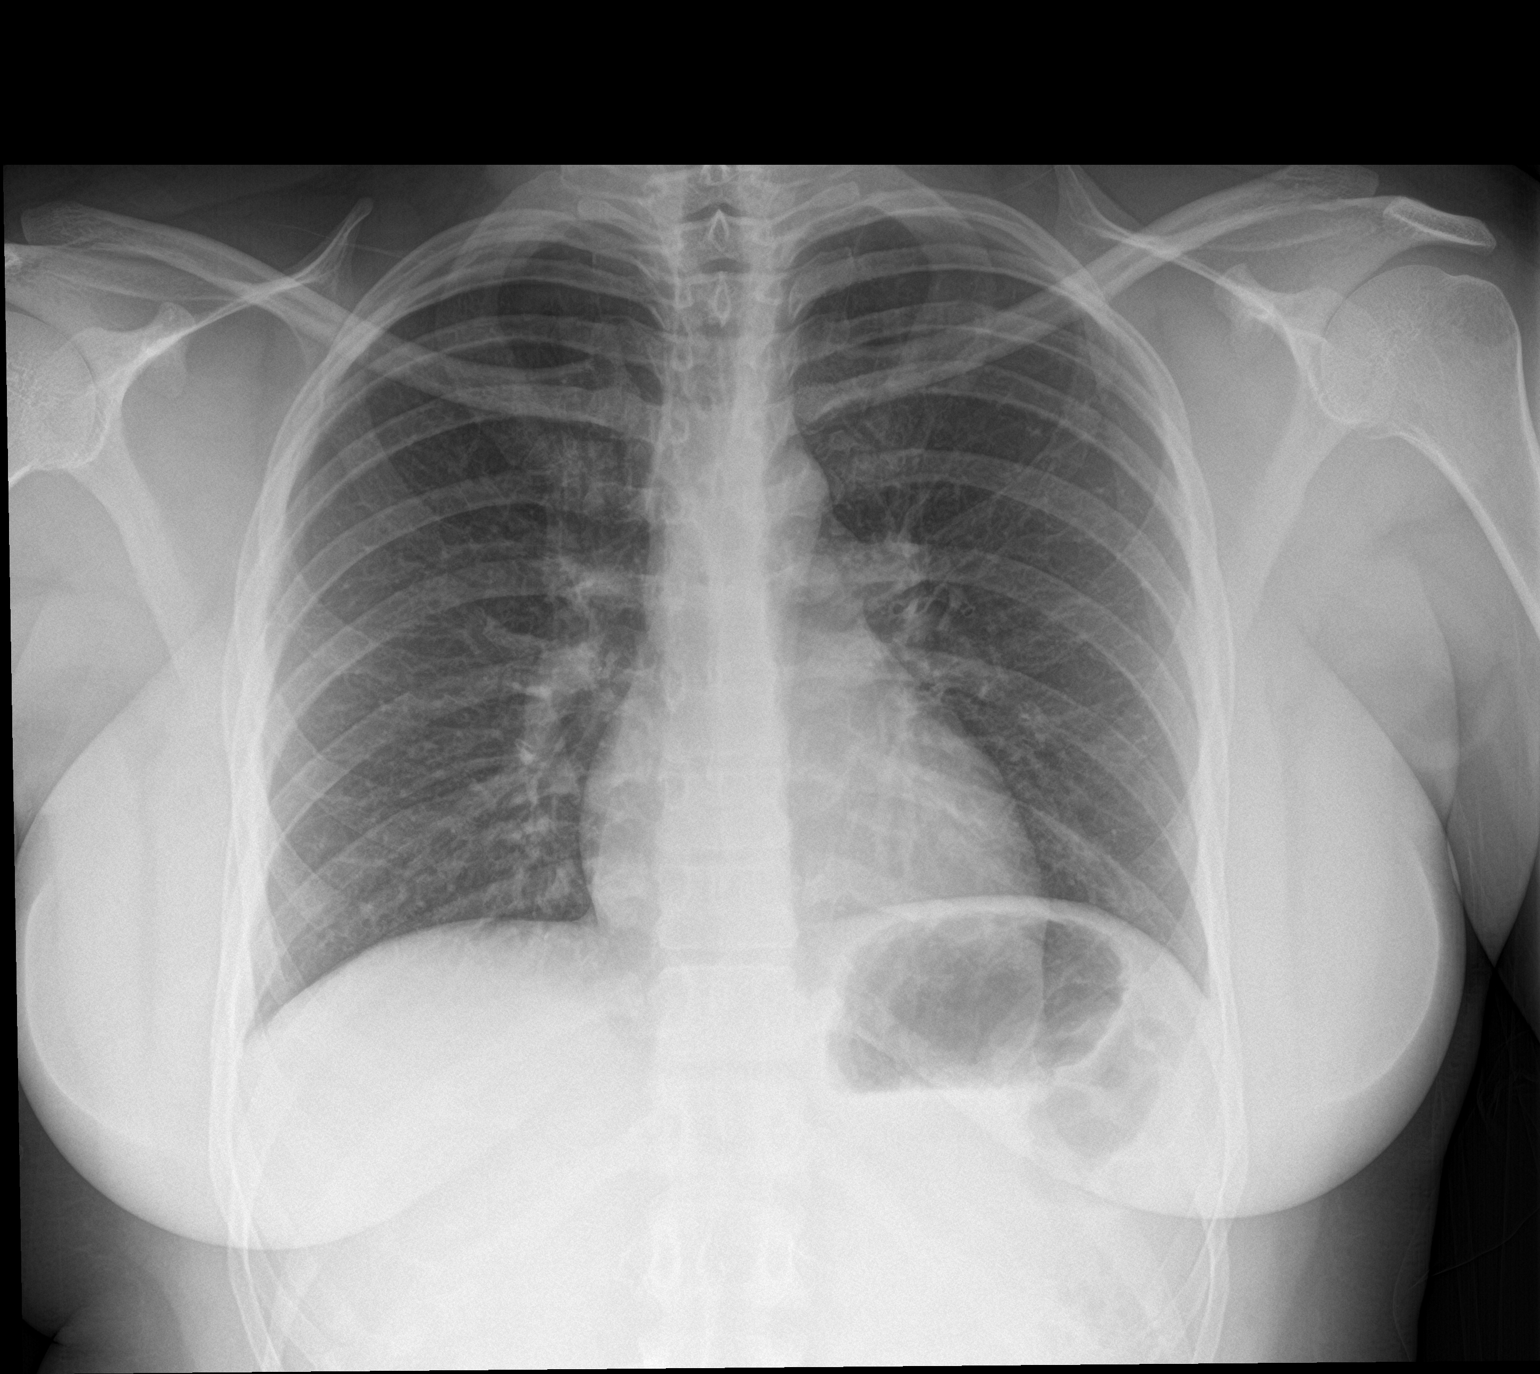

[chest lat]
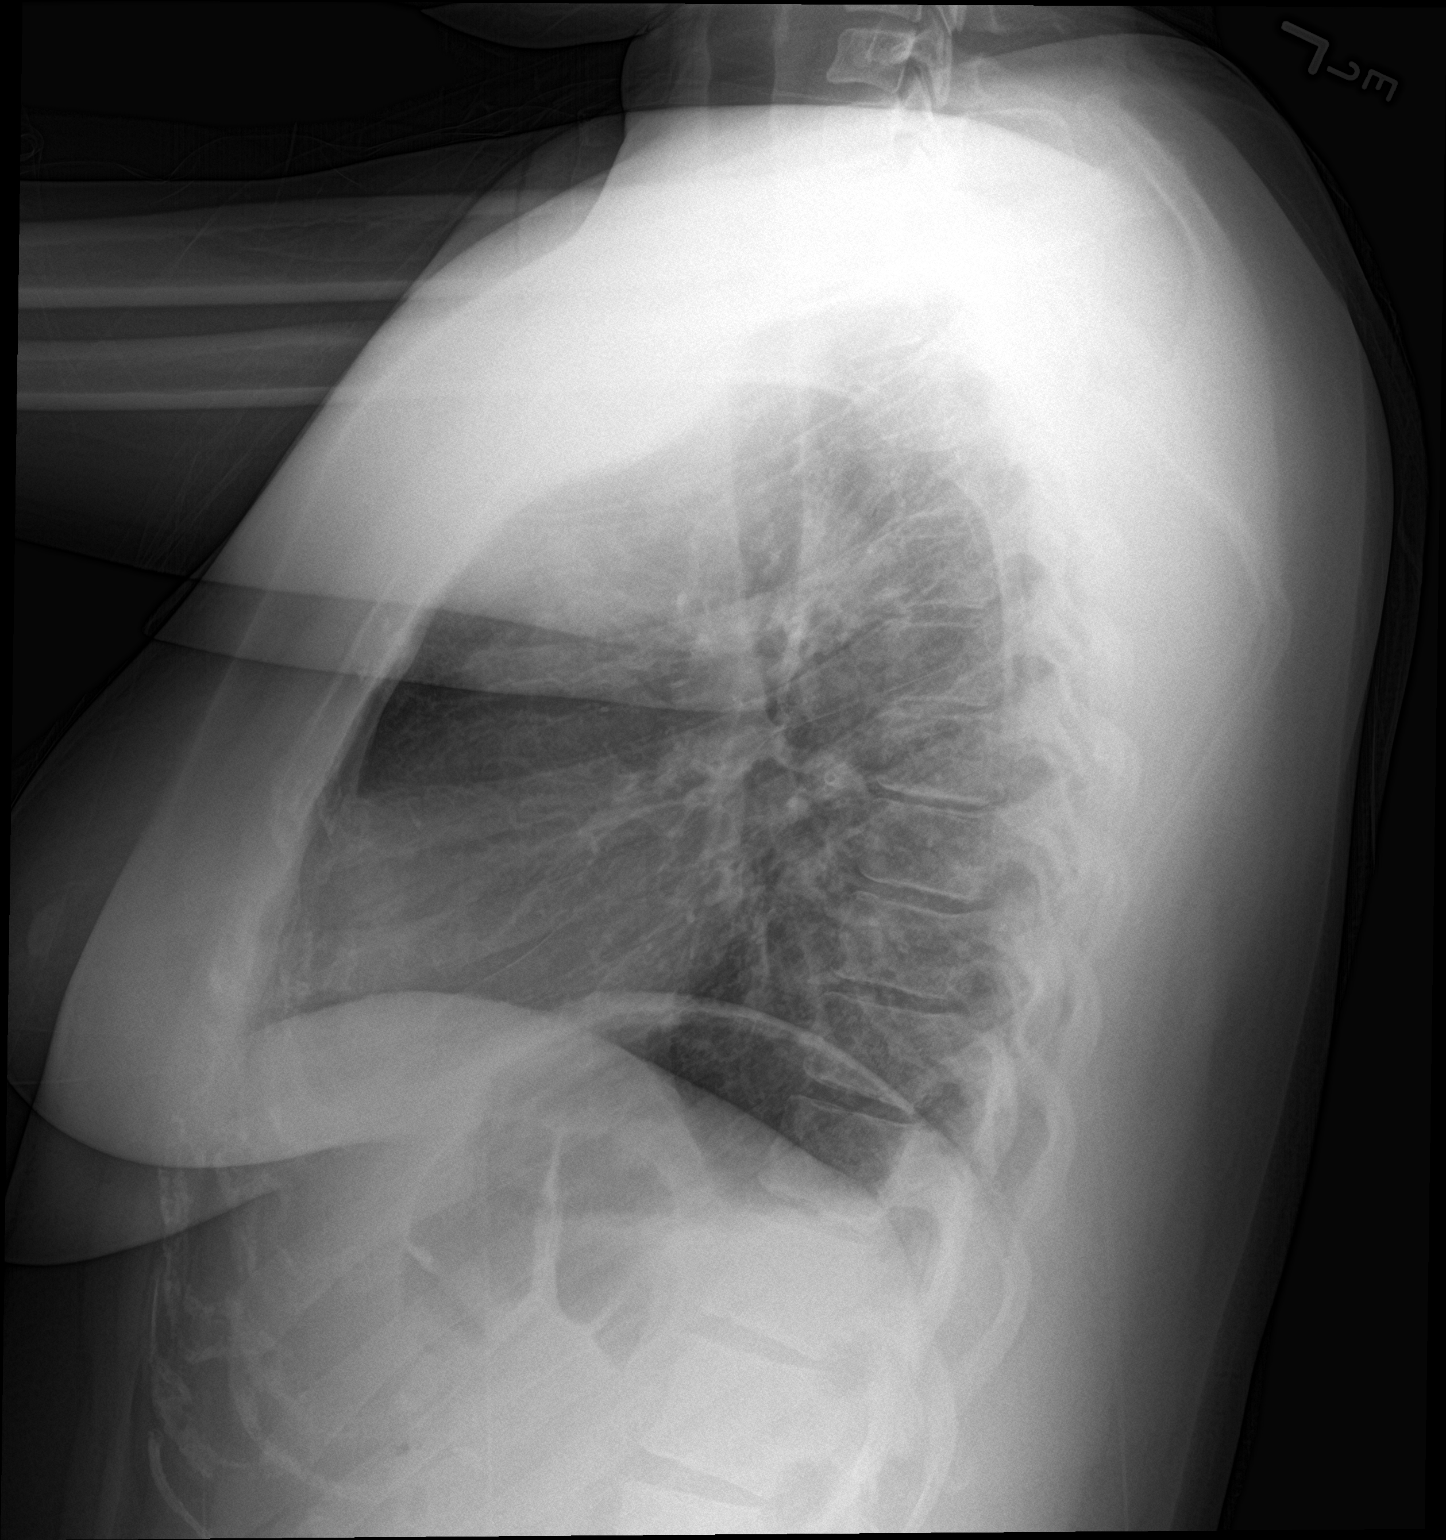

[2 of 2 positions shown; findings below may reference images not displayed]

FINDINGS: The heart and mediastinal contours are unchanged.

No focal consolidation. No pulmonary edema. No pleural effusion. No
pneumothorax.

No acute osseous abnormality.
IMPRESSION: No active cardiopulmonary disease.

## 2023-04-03 ENCOUNTER — Encounter (HOSPITAL_BASED_OUTPATIENT_CLINIC_OR_DEPARTMENT_OTHER): Payer: Self-pay | Admitting: Emergency Medicine

## 2023-04-03 ENCOUNTER — Emergency Department (HOSPITAL_BASED_OUTPATIENT_CLINIC_OR_DEPARTMENT_OTHER)
Admission: EM | Admit: 2023-04-03 | Discharge: 2023-04-03 | Disposition: A | Discharge status: Home / Self Care | Payer: Medicaid Other | Attending: Emergency Medicine | Admitting: Emergency Medicine

## 2023-04-03 ENCOUNTER — Other Ambulatory Visit: Payer: Self-pay

## 2023-04-03 DIAGNOSIS — H6690 Otitis media, unspecified, unspecified ear: Secondary | ICD-10-CM | POA: Diagnosis not present

## 2023-04-03 DIAGNOSIS — H9201 Otalgia, right ear: Secondary | ICD-10-CM | POA: Diagnosis present

## 2023-04-03 LAB — GROUP A STREP BY PCR: Group A Strep by PCR: NOT DETECTED

## 2023-04-03 MED ORDER — AMOXICILLIN 500 MG PO CAPS: 500.0000 mg | ORAL_CAPSULE | Freq: Three times a day (TID) | ORAL | 0 refills | Status: DC

## 2023-04-03 NOTE — ED Provider Notes (Signed)
[place] EMERGENCY DEPARTMENT AT [facility] Provider Note   CSN: [id number] Arrival date & time: 04/03/23  2123     History  Chief Complaint  Patient presents with   Otalgia   Sore Throat    Tracey Harris is a 27 y.o. female.  Patient presents to the emergency room complaining of right-sided ear pain and a sore throat which been ongoing for 3 days.  She denies any drainage, hearing loss, difficulty swallowing, difficulty breathing.  Patient denies fevers at home.  Past medical history significant for frequent headaches, migraines  HPI     Home Medications Prior to Admission medications   Medication Sig Start Date End Date Taking? Authorizing Provider  amoxicillin (AMOXIL) 500 MG capsule Take 1 capsule (500 mg total) by mouth 3 (three) times daily. 04/03/23  Yes Barrie Dunker B, PA-C  lidocaine (XYLOCAINE) 2 % solution Use as directed 15 mLs in the mouth or throat every 6 (six) hours as needed for mouth pain. 12/01/21   Haskel Schroeder, PA-C  ondansetron (ZOFRAN-ODT) 4 MG disintegrating tablet Take 1 tablet (4 mg total) by mouth every 8 (eight) hours as needed for nausea or vomiting. 12/01/21   Haskel Schroeder, PA-C  ondansetron (ZOFRAN-ODT) 4 MG disintegrating tablet Take 1 tablet (4 mg total) by mouth every 8 (eight) hours as needed for nausea or vomiting. 12/22/21   Tanda Rockers, PA-C  promethazine-dextromethorphan (PROMETHAZINE-DM) 6.25-15 MG/5ML syrup Take 5 mLs by mouth 4 (four) times daily as needed for cough. 12/22/21   Tanda Rockers, PA-C      Allergies    Patient has no known allergies.    Review of Systems   Review of Systems  Physical Exam Updated Vital Signs BP 130/83 (BP Location: Right Arm)   Pulse 89   Temp 98.1 F (36.7 C)   Resp 18   LMP 03/13/2023 (Approximate)   SpO2 98%  Physical Exam Vitals and nursing note reviewed.  Constitutional:      General: She is not in acute distress.    Appearance: She is well-developed.   HENT:     Head: Normocephalic and atraumatic.     Right Ear: A middle ear effusion is present. Tympanic membrane is erythematous.     Left Ear: Tympanic membrane normal.     Mouth/Throat:     Mouth: Mucous membranes are moist.     Pharynx: Uvula midline. Posterior oropharyngeal erythema present. No pharyngeal swelling or oropharyngeal exudate.  Eyes:     Conjunctiva/sclera: Conjunctivae normal.  Cardiovascular:     Rate and Rhythm: Normal rate and regular rhythm.     Heart sounds: No murmur heard. Pulmonary:     Effort: Pulmonary effort is normal. No respiratory distress.     Breath sounds: Normal breath sounds.  Abdominal:     Palpations: Abdomen is soft.     Tenderness: There is no abdominal tenderness.  Musculoskeletal:        General: No swelling.     Cervical back: Neck supple.  Skin:    General: Skin is warm and dry.     Capillary Refill: Capillary refill takes less than 2 seconds.  Neurological:     Mental Status: She is alert.  Psychiatric:        Mood and Affect: Mood normal.     ED Results / Procedures / Treatments   Labs (all labs ordered are listed, but only abnormal results are displayed) Labs Reviewed  GROUP A STREP BY PCR  EKG None  Radiology No results found.  Procedures Procedures    Medications Ordered in ED Medications - No data to display  ED Course/ Medical Decision Making/ A&P                             Medical Decision Making  Patient presents to the emergency room with a chief complaint of right-sided ear pain.  Differential includes otitis media, otitis externa, perforated tympanic membrane, and others  I ordered and reviewed labs.  Patient was negative for group A strep  Examination of the patient's right TM shows an erythematous TM with an effusion.  Examination appears consistent with otitis media.  No significant external ear pain.  Throat was unremarkable.  Plan to discharge home on antibiotics with coverage for otitis  media.        Final Clinical Impression(s) / ED Diagnoses Final diagnoses:  Acute otitis media, unspecified otitis media type    Rx / DC Orders ED Discharge Orders          Ordered    amoxicillin (AMOXIL) 500 MG capsule  3 times daily        04/03/23 2310              Pamala Duffel 04/03/23 2310    Benjiman Core, MD 04/03/23 2337

## 2023-04-03 NOTE — ED Triage Notes (Signed)
Right ear pain and sore throat. X 3 days.

## 2023-04-03 NOTE — Discharge Instructions (Addendum)
You were diagnosed today with an ear infection.  Please take the medication as prescribed.  Please follow-up with primary care for further evaluation and management.

## 2023-12-17 ENCOUNTER — Other Ambulatory Visit: Payer: Self-pay

## 2023-12-17 ENCOUNTER — Encounter (HOSPITAL_BASED_OUTPATIENT_CLINIC_OR_DEPARTMENT_OTHER): Payer: Self-pay | Admitting: *Deleted

## 2023-12-17 ENCOUNTER — Emergency Department (HOSPITAL_BASED_OUTPATIENT_CLINIC_OR_DEPARTMENT_OTHER)
Admission: EM | Admit: 2023-12-17 | Discharge: 2023-12-17 | Disposition: A | Discharge status: Home / Self Care | Payer: Self-pay | Attending: Emergency Medicine | Admitting: Emergency Medicine

## 2023-12-17 DIAGNOSIS — S0501XA Injury of conjunctiva and corneal abrasion without foreign body, right eye, initial encounter: Secondary | ICD-10-CM | POA: Insufficient documentation

## 2023-12-17 DIAGNOSIS — X58XXXA Exposure to other specified factors, initial encounter: Secondary | ICD-10-CM | POA: Insufficient documentation

## 2023-12-17 MED ORDER — TETRACAINE HCL 0.5 % OP SOLN
2.0000 [drp] | Freq: Once | OPHTHALMIC | Status: AC
  Administered 2023-12-17: 2 [drp] via OPHTHALMIC
  Filled 2023-12-17: qty 4

## 2023-12-17 MED ORDER — FLUORESCEIN SODIUM 1 MG OP STRP
1.0000 | ORAL_STRIP | Freq: Once | OPHTHALMIC | Status: AC
  Administered 2023-12-17: 1 via OPHTHALMIC
  Filled 2023-12-17: qty 1

## 2023-12-17 MED ORDER — SULFACETAMIDE SODIUM 10 % OP SOLN
2.0000 [drp] | Freq: Once | OPHTHALMIC | Status: AC
  Administered 2023-12-17: 2 [drp] via OPHTHALMIC
  Filled 2023-12-17: qty 15

## 2023-12-17 NOTE — ED Notes (Signed)
 Woods lamp, fluorescein  strip and tetracaine  at bedside.

## 2023-12-17 NOTE — ED Triage Notes (Signed)
 Pt states that she was poked in her right eye by her toddler Saturday and she has tried to treat it with artificial tears and saline but she continued to have pain in her eye and she has redness and swelling.

## 2023-12-17 NOTE — Discharge Instructions (Signed)
 Use the Bleph -10 eyedrops, 2 drops in the right eye 4 times daily for the next several days.  Take ibuprofen  600 mg every 6 hours as needed for pain.  Return to the ER if symptoms significantly worsen or change.

## 2023-12-17 NOTE — ED Provider Notes (Signed)
  [place] EMERGENCY DEPARTMENT AT [facility] Provider Note   CSN: [id number] Arrival date & time: 12/17/23  0230     History  Chief Complaint  Patient presents with   Eye Injury    Tracey Harris is a 28 y.o. female.  Patient is a 28 year old female presenting with complaints of right eye pain.  She reports being scratched in the eye by her young child yesterday.  She has had irritation and discomfort since.  No aggravating or alleviating factors.  The history is provided by the patient.       Home Medications Prior to Admission medications   Medication Sig Start Date End Date Taking? Authorizing Provider  amoxicillin  (AMOXIL ) 500 MG capsule Take 1 capsule (500 mg total) by mouth 3 (three) times daily. 04/03/23   Elisa Guest, PA-C  lidocaine  (XYLOCAINE ) 2 % solution Use as directed 15 mLs in the mouth or throat every 6 (six) hours as needed for mouth pain. 12/01/21   Marshal Skeens, PA-C  ondansetron  (ZOFRAN -ODT) 4 MG disintegrating tablet Take 1 tablet (4 mg total) by mouth every 8 (eight) hours as needed for nausea or vomiting. 12/01/21   Marshal Skeens, PA-C  ondansetron  (ZOFRAN -ODT) 4 MG disintegrating tablet Take 1 tablet (4 mg total) by mouth every 8 (eight) hours as needed for nausea or vomiting. 12/22/21   Venter, Margaux, PA-C  promethazine -dextromethorphan (PROMETHAZINE -DM) 6.25-15 MG/5ML syrup Take 5 mLs by mouth 4 (four) times daily as needed for cough. 12/22/21   Lavinia Pouch, PA-C      Allergies    Patient has no known allergies.    Review of Systems   Review of Systems  All other systems reviewed and are negative.   Physical Exam Updated Vital Signs BP (!) 130/95   Pulse 91   Temp 98.6 F (37 C) (Oral)   Resp 16   LMP 12/10/2023   SpO2 99%  Physical Exam Vitals and nursing note reviewed.  Constitutional:      Appearance: Normal appearance.  Eyes:     Extraocular Movements: Extraocular movements intact.      Pupils: Pupils are equal, round, and reactive to light.     Comments: The right cornea appears clear, but does have a small area of fluorescein  uptake noted centrally.  Pulmonary:     Effort: Pulmonary effort is normal.  Skin:    General: Skin is warm and dry.  Neurological:     Mental Status: She is alert and oriented to person, place, and time.     ED Results / Procedures / Treatments   Labs (all labs ordered are listed, but only abnormal results are displayed) Labs Reviewed - No data to display  EKG None  Radiology No results found.  Procedures Procedures    Medications Ordered in ED Medications  tetracaine  (PONTOCAINE) 0.5 % ophthalmic solution 2 drop (has no administration in time range)  fluorescein  ophthalmic strip 1 strip (has no administration in time range)    ED Course/ Medical Decision Making/ A&P  Patient presenting with right eye pain as described in the HPI.  She has a small corneal abrasion with fluorescein  staining.  She will be given Bleph -10 drops, advised to take ibuprofen , and is to follow-up as needed.  Final Clinical Impression(s) / ED Diagnoses Final diagnoses:  None    Rx / DC Orders ED Discharge Orders     None         Orvilla Blander, MD 12/17/23 [phone]

## 2024-07-20 ENCOUNTER — Encounter (HOSPITAL_BASED_OUTPATIENT_CLINIC_OR_DEPARTMENT_OTHER): Payer: Self-pay

## 2024-07-20 ENCOUNTER — Other Ambulatory Visit: Payer: Self-pay

## 2024-07-20 ENCOUNTER — Emergency Department (HOSPITAL_BASED_OUTPATIENT_CLINIC_OR_DEPARTMENT_OTHER)
Admission: EM | Admit: 2024-07-20 | Discharge: 2024-07-20 | Disposition: A | Discharge status: Home / Self Care | Payer: Self-pay | Attending: Emergency Medicine | Admitting: Emergency Medicine

## 2024-07-20 DIAGNOSIS — W458XXA Other foreign body or object entering through skin, initial encounter: Secondary | ICD-10-CM | POA: Insufficient documentation

## 2024-07-20 DIAGNOSIS — S0501XA Injury of conjunctiva and corneal abrasion without foreign body, right eye, initial encounter: Secondary | ICD-10-CM | POA: Insufficient documentation

## 2024-07-20 MED ORDER — ERYTHROMYCIN 5 MG/GM OP OINT: TOPICAL_OINTMENT | OPHTHALMIC | 0 refills | Status: DC

## 2024-07-20 MED ORDER — TETRACAINE HCL 0.5 % OP SOLN
1.0000 [drp] | Freq: Once | OPHTHALMIC | Status: AC
  Administered 2024-07-20: 1 [drp] via OPHTHALMIC
  Filled 2024-07-20: qty 4

## 2024-07-20 MED ORDER — FLUORESCEIN SODIUM 1 MG OP STRP
1.0000 | ORAL_STRIP | Freq: Once | OPHTHALMIC | Status: AC
Start: 2024-07-20 — End: 2024-07-20
  Administered 2024-07-20: 1 via OPHTHALMIC
  Filled 2024-07-20: qty 1

## 2024-07-20 MED ORDER — ERYTHROMYCIN 5 MG/GM OP OINT
TOPICAL_OINTMENT | Freq: Once | OPHTHALMIC | Status: AC
  Filled 2024-07-20: qty 3.5

## 2024-07-20 NOTE — ED Triage Notes (Signed)
 Scratched in right eye yesterday by her daughter. Painful and swollen.

## 2024-07-20 NOTE — ED Provider Notes (Signed)
 [place] EMERGENCY DEPARTMENT AT [facility] Provider Note   CSN: [id number] Arrival date & time: 07/20/24  1315    Patient presents with: Eye Pain   Tracey Harris is a 28 y.o. female here for evaluation of right eye pain.  Noted she was playing with her daughter yesterday 1 she scratched her right eye.  She does not wear contacts.  She does wear glasses at night to watch TV at a distance.  Since then she has had pain, clear watery drainage to her right eye.  Describes her pain as a burning.  States it feels like razors when she blinks her eyes.  No purulent drainage.  She denies any vision changes.  No periorbital erythema, swelling.  Tetanus within the last 5-year   HPI     Prior to Admission medications   Medication Sig Start Date End Date Taking? Authorizing Provider  erythromycin  ophthalmic ointment Place a 1/2 inch ribbon of ointment into the RIGHT lower eyelid twice daily 07/20/24  Yes Nava Song A, PA-C  amoxicillin  (AMOXIL ) 500 MG capsule Take 1 capsule (500 mg total) by mouth 3 (three) times daily. 04/03/23   Logan Ubaldo NOVAK, PA-C  lidocaine  (XYLOCAINE ) 2 % solution Use as directed 15 mLs in the mouth or throat every 6 (six) hours as needed for mouth pain. 12/01/21   Eudelia Maude SAUNDERS, PA-C  ondansetron  (ZOFRAN -ODT) 4 MG disintegrating tablet Take 1 tablet (4 mg total) by mouth every 8 (eight) hours as needed for nausea or vomiting. 12/01/21   Eudelia Maude SAUNDERS, PA-C  ondansetron  (ZOFRAN -ODT) 4 MG disintegrating tablet Take 1 tablet (4 mg total) by mouth every 8 (eight) hours as needed for nausea or vomiting. 12/22/21   Shepard, Margaux, PA-C  promethazine -dextromethorphan (PROMETHAZINE -DM) 6.25-15 MG/5ML syrup Take 5 mLs by mouth 4 (four) times daily as needed for cough. 12/22/21   Venter, Margaux, PA-C    Allergies: Patient has no known allergies.    Review of Systems  Constitutional: Negative.   HENT: Negative.    Eyes:  Positive for pain,  discharge, redness and itching. Negative for visual disturbance.  Respiratory: Negative.    Cardiovascular: Negative.   Gastrointestinal: Negative.   Genitourinary: Negative.   Musculoskeletal: Negative.   Skin: Negative.   Neurological: Negative.   All other systems reviewed and are negative.   Updated Vital Signs BP (!) 145/117   Pulse 99   Temp 98.5 F (36.9 C) (Oral)   Resp 15   Ht 5' 8.5 (1.74 m)   Wt 66.2 kg   SpO2 100%   BMI 21.88 kg/m   Physical Exam Vitals and nursing note reviewed.  Constitutional:      General: She is not in acute distress.    Appearance: She is well-developed. She is not ill-appearing.  HENT:     Head: Atraumatic.  Eyes:     General: Lids are everted, no foreign bodies appreciated. Vision grossly intact. No allergic shiner, visual field deficit or scleral icterus.       Right eye: Discharge present. No foreign body or hordeolum.        Left eye: No foreign body, discharge or hordeolum.     Extraocular Movements: Extraocular movements intact.     Conjunctiva/sclera:     Right eye: No hemorrhage.    Left eye: Left conjunctiva is not injected. No chemosis, exudate or hemorrhage.    Pupils: Pupils are equal, round, and reactive to light.      Comments: PERRLA Fluorescein   uptake right eye 3 o'clock position 3mm rounded, negative Seidel sign.  Mild scleral injection.  No chemosis, purulent drainage, does have clear watery drainage to right eye Left eye clear  Cardiovascular:     Rate and Rhythm: Normal rate.  Musculoskeletal:     Cervical back: Normal range of motion.  Skin:    General: Skin is warm and dry.  Neurological:     General: No focal deficit present.     Mental Status: She is alert.  Psychiatric:        Mood and Affect: Mood normal.     (all labs ordered are listed, but only abnormal results are displayed) Labs Reviewed - No data to display  EKG: None  Radiology: No results found.   Procedures   Medications  Ordered in the ED  erythromycin  ophthalmic ointment (has no administration in time range)  tetracaine  (PONTOCAINE) 0.5 % ophthalmic solution 1 drop (1 drop Right Eye Given by Other 07/20/24 1458)  fluorescein  ophthalmic strip 1 strip (1 strip Both Eyes Given by Other 07/20/24 546)   28 year old here for evaluation of right eye pain after a daughter accidentally scratched her eye last night.  Does not wear contacts.  She has pain and clear watery drainage to the right eye.  Here she has fluorescein  uptake at the 3 o'clock position, 3 mm rounded on the right eye.  Some mild scleral injection.  No chemosis, jaundice.  Negative Seidel sign.  She has no periorbital erythema, swelling.  No vision changes.  Her tetanus is up-to-date.  Started on erythromycin  eye ointment.  Recommend close follow-up outpatient with ophthalmology.  She will return for worsening symptoms.  The patient has been appropriately medically screened and/or stabilized in the ED. I have low suspicion for any other emergent medical condition which would require further screening, evaluation or treatment in the ED or require inpatient management.  Patient is hemodynamically stable and in no acute distress.  Patient able to ambulate in department prior to ED.  Evaluation does not show acute pathology that would require ongoing or additional emergent interventions while in the emergency department or further inpatient treatment.  I have discussed the diagnosis with the patient and answered all questions.  Pain is been managed while in the emergency department and patient has no further complaints prior to discharge.  Patient is comfortable with plan discussed in room and is stable for discharge at this time.  I have discussed strict return precautions for returning to the emergency department.  Patient was encouraged to follow-up with PCP/specialist refer to at discharge.                                    Medical Decision Making Amount  and/or Complexity of Data Reviewed External Data Reviewed: labs, radiology and notes.  Risk OTC drugs. Prescription drug management. Decision regarding hospitalization. Diagnosis or treatment significantly limited by social determinants of health.       Final diagnoses:  Abrasion of right cornea, initial encounter    ED Discharge Orders          Ordered    erythromycin  ophthalmic ointment        07/20/24 1521               Han Vejar A, PA-C 07/20/24 1528    Ruthe Cornet, DO 07/20/24 1636

## 2024-07-20 NOTE — Discharge Instructions (Addendum)
 It was a pleasure taking care of you here today.    As we discussed you have a corneal abrasion which is a scratch on your right eye.  I prescribed an ointment.  May also take Tylenol  or ibuprofen  as needed for pain.  Follow-up with the eye doctor.  If you do not have 1 there is 1 listed in your discharge paperwork.  Call tomorrow morning to schedule an appointment.  Return for any worsening symptoms

## 2024-10-13 ENCOUNTER — Ambulatory Visit (HOSPITAL_COMMUNITY)
Admission: EM | Admit: 2024-10-13 | Discharge: 2024-10-13 | Disposition: A | Discharge status: Home / Self Care | Payer: Self-pay

## 2024-10-13 ENCOUNTER — Encounter (HOSPITAL_COMMUNITY): Payer: Self-pay

## 2024-10-13 DIAGNOSIS — J069 Acute upper respiratory infection, unspecified: Secondary | ICD-10-CM

## 2024-10-13 MED ORDER — PROMETHAZINE-DM 6.25-15 MG/5ML PO SYRP: 5.0000 mL | ORAL_SOLUTION | Freq: Four times a day (QID) | ORAL | 0 refills | Status: DC | PRN

## 2024-10-13 MED ORDER — PSEUDOEPHEDRINE HCL 30 MG PO TABS: 30.0000 mg | ORAL_TABLET | ORAL | 0 refills | Status: DC | PRN

## 2024-10-13 MED ORDER — AZELASTINE HCL 0.1 % NA SOLN: 2.0000 | Freq: Two times a day (BID) | NASAL | 2 refills | Status: AC

## 2024-10-13 NOTE — Discharge Instructions (Addendum)
-   Use the Sudafed, 1 pill up to every 4 hours for nasal congestion.  This medication will give you energy, so do not take right before bed. -Promethazine  DM cough syrup for congestion/cough. This could make you drowsy, so take at night before bed. - Use the azelastine  nasal spray for nasal congestion, 2 sprays twice daily while symptoms persist. -Continue over-the-counter medications if they are helping.

## 2024-10-13 NOTE — ED Triage Notes (Signed)
 Patient here today with c/o cough, fever, ST, headache, and chest discomfort, chills, sweats, fatigue, loss of appetite, and nasal congestion X 5 days. Patient has been taking Nyquil and Theraflu with no relief. Her daughter was sick 2 weeks ago with the same symptoms.
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
## 2024-12-09 ENCOUNTER — Encounter (HOSPITAL_COMMUNITY): Payer: Self-pay

## 2024-12-09 ENCOUNTER — Ambulatory Visit (HOSPITAL_COMMUNITY)
Admission: EM | Admit: 2024-12-09 | Discharge: 2024-12-09 | Disposition: A | Discharge status: Home / Self Care | Payer: Self-pay | Source: Home / Self Care | Attending: Family Medicine | Admitting: Family Medicine

## 2024-12-09 DIAGNOSIS — H9202 Otalgia, left ear: Secondary | ICD-10-CM

## 2024-12-09 DIAGNOSIS — H5789 Other specified disorders of eye and adnexa: Secondary | ICD-10-CM

## 2024-12-09 DIAGNOSIS — J0100 Acute maxillary sinusitis, unspecified: Secondary | ICD-10-CM

## 2024-12-09 MED ORDER — AMOXICILLIN-POT CLAVULANATE 875-125 MG PO TABS: 1.0000 | ORAL_TABLET | Freq: Two times a day (BID) | ORAL | 0 refills | Status: AC

## 2024-12-09 MED ORDER — TOBRAMYCIN 0.3 % OP SOLN: 1.0000 [drp] | Freq: Four times a day (QID) | OPHTHALMIC | 0 refills | Status: AC

## 2024-12-09 NOTE — ED Triage Notes (Addendum)
 Patient reports that she scratched her right ey 3 days ago and the eye is sore when she blinks and slight blurriness  Patient also c/o nasal congestion, sore throat, and left ear pain x 3 days.  Patient states she used artificial tears to the right eye.  Patient has also taken Tylenol  Cold and Cough, Chloraseptic spray. and nasal spray.  Patient wears glasses, but did not bring them with her. Unable to do vision acuity test.
